# Patient Record
Sex: Female | Born: 1935 | Race: White | Hispanic: No | Smoking: Never smoker
Health system: Southern US, Community
[De-identification: ages and names within clinical notes are randomized; demographics above are authoritative.]

## PROBLEM LIST (undated history)

## (undated) DIAGNOSIS — K219 Gastro-esophageal reflux disease without esophagitis: Secondary | ICD-10-CM

## (undated) DIAGNOSIS — I480 Paroxysmal atrial fibrillation: Secondary | ICD-10-CM

## (undated) DIAGNOSIS — I442 Atrioventricular block, complete: Secondary | ICD-10-CM

## (undated) DIAGNOSIS — Z950 Presence of cardiac pacemaker: Secondary | ICD-10-CM

## (undated) DIAGNOSIS — I10 Essential (primary) hypertension: Secondary | ICD-10-CM

## (undated) HISTORY — DX: Atrioventricular block, complete: I44.2

## (undated) HISTORY — DX: Paroxysmal atrial fibrillation: I48.0

## (undated) HISTORY — DX: Gastro-esophageal reflux disease without esophagitis: K21.9

## (undated) HISTORY — DX: Essential (primary) hypertension: I10

---

## 2003-07-26 HISTORY — PX: PACEMAKER INSERTION: SHX728

## 2004-02-27 ENCOUNTER — Ambulatory Visit: Payer: Self-pay | Admitting: Internal Medicine

## 2004-02-27 ENCOUNTER — Inpatient Hospital Stay (HOSPITAL_COMMUNITY): Admission: AD | Admit: 2004-02-27 | Discharge: 2004-02-29 | Payer: Self-pay | Admitting: Internal Medicine

## 2004-03-17 ENCOUNTER — Encounter: Payer: Self-pay | Admitting: Cardiology

## 2004-12-07 IMAGING — CR DG CHEST 1V PORT
1 series · 1 of 1 positions shown · non-contrast
Comparison: none

CLINICAL DATA: status post pacemaker placement
 PORTABLE CHEST ONE VIEW 02/28/04
 Left-sided pacer is in place.  Leads are in the right atrium and right ventricle.  No pneumothorax.  There is cardiomegaly with mild interstitial edema and bibasilar atelectasis.  
 IMPRESSION
 Left-sided pacer without pneumothorax.  
 Mild interstitial edema and bibasilar atelectasis.

[view not recorded]
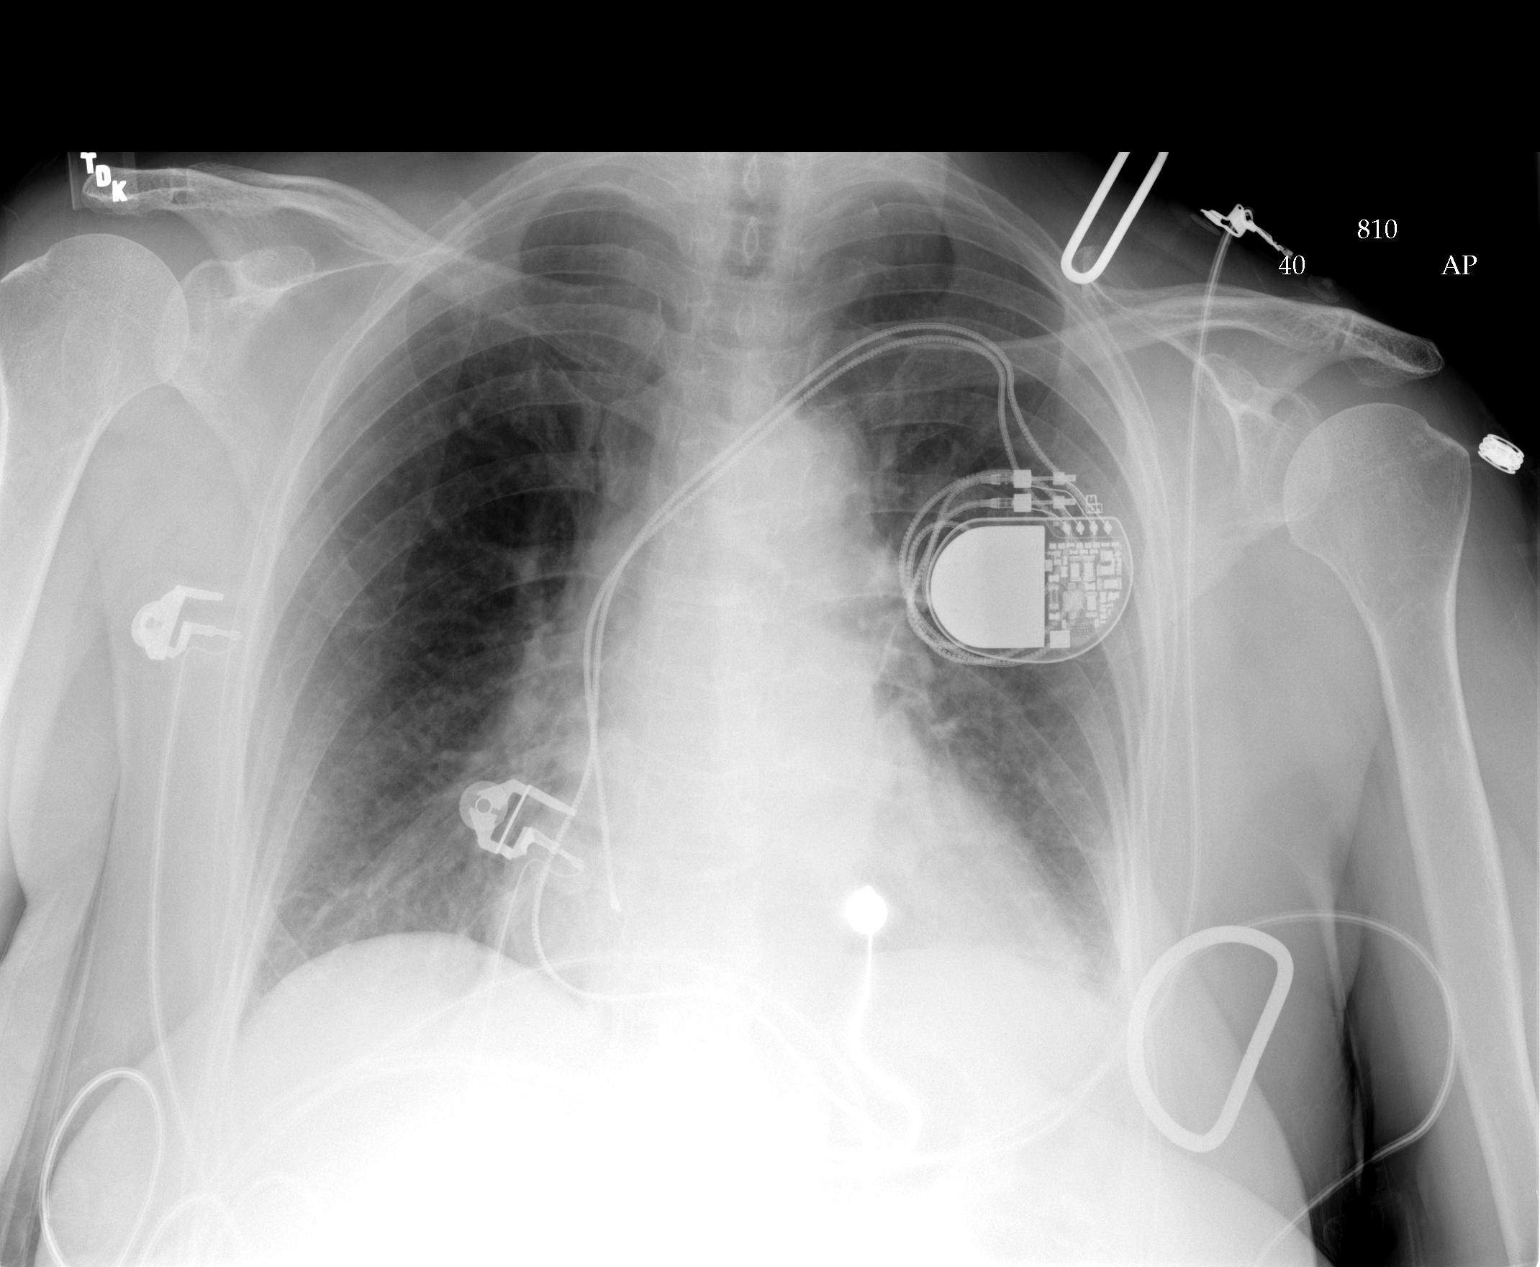

[1 of 1 positions shown; findings below may reference images not displayed]

## 2004-12-14 ENCOUNTER — Ambulatory Visit: Payer: Self-pay | Admitting: Cardiology

## 2004-12-14 ENCOUNTER — Encounter: Payer: Self-pay | Admitting: Internal Medicine

## 2005-12-13 ENCOUNTER — Ambulatory Visit: Payer: Self-pay | Admitting: Cardiology

## 2006-01-19 ENCOUNTER — Ambulatory Visit: Payer: Self-pay | Admitting: Cardiology

## 2007-02-02 ENCOUNTER — Ambulatory Visit: Payer: Self-pay | Admitting: Cardiology

## 2007-08-24 ENCOUNTER — Ambulatory Visit: Payer: Self-pay | Admitting: Internal Medicine

## 2008-03-21 ENCOUNTER — Ambulatory Visit: Payer: Self-pay | Admitting: Internal Medicine

## 2008-09-09 ENCOUNTER — Encounter: Payer: Self-pay | Admitting: Internal Medicine

## 2008-09-18 ENCOUNTER — Ambulatory Visit: Payer: Self-pay | Admitting: Internal Medicine

## 2008-11-04 ENCOUNTER — Ambulatory Visit: Payer: Self-pay | Admitting: Internal Medicine

## 2009-02-23 ENCOUNTER — Ambulatory Visit: Payer: Self-pay | Admitting: Internal Medicine

## 2009-04-09 DIAGNOSIS — I472 Ventricular tachycardia, unspecified: Secondary | ICD-10-CM | POA: Insufficient documentation

## 2009-04-09 DIAGNOSIS — Z950 Presence of cardiac pacemaker: Secondary | ICD-10-CM | POA: Insufficient documentation

## 2009-04-09 DIAGNOSIS — I10 Essential (primary) hypertension: Secondary | ICD-10-CM | POA: Insufficient documentation

## 2009-04-09 DIAGNOSIS — I442 Atrioventricular block, complete: Secondary | ICD-10-CM | POA: Insufficient documentation

## 2009-04-10 ENCOUNTER — Ambulatory Visit: Payer: Self-pay | Admitting: Internal Medicine

## 2009-05-25 ENCOUNTER — Ambulatory Visit: Payer: Self-pay | Admitting: Internal Medicine

## 2009-08-24 ENCOUNTER — Ambulatory Visit: Payer: Self-pay | Admitting: Internal Medicine

## 2009-10-16 ENCOUNTER — Ambulatory Visit: Payer: Self-pay | Admitting: Internal Medicine

## 2009-10-16 DIAGNOSIS — I4891 Unspecified atrial fibrillation: Secondary | ICD-10-CM | POA: Insufficient documentation

## 2009-11-23 ENCOUNTER — Ambulatory Visit: Payer: Self-pay | Admitting: Internal Medicine

## 2010-02-22 ENCOUNTER — Ambulatory Visit: Payer: Self-pay | Admitting: Internal Medicine

## 2010-05-21 ENCOUNTER — Encounter (INDEPENDENT_AMBULATORY_CARE_PROVIDER_SITE_OTHER): Payer: Self-pay | Admitting: *Deleted

## 2010-05-24 ENCOUNTER — Ambulatory Visit: Payer: Self-pay | Admitting: Internal Medicine

## 2010-06-11 ENCOUNTER — Ambulatory Visit: Payer: Self-pay | Admitting: Internal Medicine

## 2010-08-23 ENCOUNTER — Encounter: Payer: Self-pay | Admitting: Internal Medicine

## 2010-08-24 ENCOUNTER — Ambulatory Visit: Payer: Self-pay | Admitting: Internal Medicine

## 2010-08-24 NOTE — Cardiovascular Report (Signed)
Summary: TTM   TTM   Imported By: Roderic Ovens 03/10/2010 10:52:23  _____________________________________________________________________  External Attachment:    Type:   Image     Comment:   External Document

## 2010-08-24 NOTE — Assessment & Plan Note (Signed)
Summary: 6 mo fu per march reminder-srs   Visit Type:  Pacemaker check Primary Provider:  Sasser   History of Present Illness: The patient presents today for routine electrophysiology followup. She reports doing very well since last being seen in our clinic. The patient denies symptoms of palpitations, chest pain, shortness of breath, orthopnea, PND, lower extremity edema, dizziness, presyncope, syncope, or neurologic sequela. The patient is tolerating medications without difficulties and is otherwise without complaint today.   Preventive Screening-Counseling & Management  Alcohol-Tobacco     Smoking Status: never  Current Medications (verified): 1)  Evista 60 Mg Tabs (Raloxifene Hcl) .... Once Daily 2)  Niacin Cr 500 Mg Cr-Tabs (Niacin) .... Take 1 Tablet By Mouth Three Times A Day 3)  Aspirin 81 Mg Tbec (Aspirin) .... Take One Tablet By Mouth Daily 4)  Metoprolol Tartrate 25 Mg Tabs (Metoprolol Tartrate) .... Take One Tablet By Mouth Twice A Day 5)  Calcium Carbonate-Vitamin D 600-400 Mg-Unit  Tabs (Calcium Carbonate-Vitamin D) .... Once Daily 6)  Glucosamine-Chondroitin   Caps (Glucosamine-Chondroit-Vit C-Mn) .... Once Daily 7)  Fish Oil 1000 Mg Caps (Omega-3 Fatty Acids) .... Take 1 Tablet By Mouth Two Times A Day  Allergies (verified): No Known Drug Allergies  Comments:  Nurse/Medical Assistant: The patient's medications and allergies were reviewed with the patient and were updated in the Medication and Allergy Lists. Bottles/List reviewed.  Past History:  Past Medical History: PACEMAKER (ICD-V45.Marland Kitchen01) VENTRICULAR TACHYCARDIA (ICD-427.1) AV BLOCK, COMPLETE (ICD-426.0) HYPERTENSION, UNSPECIFIED (ICD-401.9) Paroxysmal atrial fibrillation  Past Surgical History: Reviewed history from 04/09/2009 and no changes required. pacemaker --  Medtronic Kappa [id number]  for Complete heart block with polymorphic ventricular  tachycardia.  ---- 02/27/04  Social History: Reviewed  history from 04/09/2009 and no changes required. Retired  Widowed  Tobacco Use - No.  Alcohol Use - no Drug Use - no  Vital Signs:  Patient profile:   76 year old female Height:      63 inches Weight:      162 pounds BMI:     28.80 Pulse rate:   80 / minute BP sitting:   144 / 82  (left arm) Cuff size:   regular  Vitals Entered By: Carlye Grippe (October 16, 2009 4:05 PM)  Nutrition Counseling: Patient's BMI is greater than 25 and therefore counseled on weight management options.  Physical Exam  General:  Well developed, well nourished, in no acute distress. Head:  normocephalic and atraumatic Mouth:  Teeth, gums and palate normal. Oral mucosa normal. Neck:  Neck supple, no JVD. No masses, thyromegaly or abnormal cervical nodes. Lungs:  Clear bilaterally to auscultation and percussion. Heart:  Non-displaced PMI, chest non-tender; regular rate and rhythm, S1, S2 without murmurs, rubs or gallops. Carotid upstroke normal, no bruit. Normal abdominal aortic size, no bruits. Femorals normal pulses, no bruits. Pedals normal pulses. No edema, no varicosities. Abdomen:  Bowel sounds positive; abdomen soft and non-tender without masses, organomegaly, or hernias noted. No hepatosplenomegaly. Msk:  Back normal, normal gait. Muscle strength and tone normal. Pulses:  pulses normal in all 4 extremities Extremities:  No clubbing or cyanosis. Neurologic:  Alert and oriented x 3.   PPM Specifications Following MD:  Sherryl Manges, MD     PPM Vendor:  Medtronic     PPM Model Number:  [id number]     [facility] Serial Number:  [id number] PPM DOI:  02/27/2004     PPM Implanting MD:  Sherryl Manges, MD  Lead 1    Location:  RA     DOI: 02/27/2004     Model #: [id number]     Serial #: [id number]     Status: active Lead 2    Location: RV     DOI: 02/27/2004     Model #: [id number]     Serial #: [id number]     Status: active  Magnet Response Rate:  BOL 85 ERI  65  Indications:  CHB with Torsades   PPM Follow Up Remote  Check?  No Battery Voltage:  2.75 V     Battery Est. Longevity:  3 years     Pacer Dependent:  No       PPM Device Measurements Atrium  Amplitude: 2.0 mV, Impedance: 443 ohms, Threshold: 1.0 V at 0.4 msec Right Ventricle  Amplitude: 5.6 mV, Impedance: 759 ohms, Threshold: 1.0 V at 0.4 msec  Episodes MS Episodes:  9     Percent Mode Switch:  0.2%     Coumadin:  No Ventricular High Rate:  0     Atrial Pacing:  33.3%     Ventricular Pacing:  99.9%  Parameters Mode:  DDDR     Lower Rate Limit:  60     Upper Rate Limit:  130 Paced AV Delay:  150     Sensed AV Delay:  120 Rate Response Parameters:  ADL response-3, Exertion response-3, Threshold-Med/Low, Acceleration-30 seconds, Decelerationn-Exercise Next Cardiology Appt Due:  03/25/2010 Tech Comments:  Longest A-fib 6:40 hours, - coumadin.  No parameter changes.  TTM's with [facility].  ROV 6 months in the [place] clinic. Altha Harm, LPN  October 16, 2009 4:11 PM  MD Comments:  agree with above  Impression & Recommendations:  Problem # 1:  AV BLOCK, COMPLETE (ICD-426.0) Normal pacemaker function no changes  Problem # 2:  ATRIAL FIBRILLATION (ICD-427.31) The patient's pacemaker suggests afib (longest episode is 6 hours).  I have discussed my concern for stroke with the patient.  Her CHADS2 score is 0, though she is approaching age 29.  We will therefore increase ASA to 325mg  daily today.  We will consider coumadin if her CHADS2 score elevates or if she has more prolonged episodes of afib.  Patient Instructions: 1)  return in 6 months

## 2010-08-24 NOTE — Cardiovascular Report (Signed)
 Summary: TTM  TTM   Imported By: Kathi Dilling 03/13/2009 13:37:37  _____________________________________________________________________  External Attachment:    Type:   Image     Comment:   External Document

## 2010-08-24 NOTE — Cardiovascular Report (Signed)
 Summary: TTM  TTM   Imported By: Kathi Dilling 07/09/2009 10:54:25  _____________________________________________________________________  External Attachment:    Type:   Image     Comment:   External Document

## 2010-08-24 NOTE — Cardiovascular Report (Signed)
Summary: Card Device Clinic/ INTERROGATION REPORT  Card Device Clinic/ INTERROGATION REPORT   Imported By: Dorise Hiss 06/16/2010 10:02:26  _____________________________________________________________________  External Attachment:    Type:   Image     Comment:   External Document

## 2010-08-24 NOTE — Cardiovascular Report (Signed)
 Summary: Office Visit  Office Visit   Imported By: Kathi Dilling 04/23/2009 16:26:01  _____________________________________________________________________  External Attachment:    Type:   Image     Comment:   External Document

## 2010-08-24 NOTE — Cardiovascular Report (Signed)
Summary: TTM   TTM   Imported By: Roderic Ovens 12/08/2009 16:01:55  _____________________________________________________________________  External Attachment:    Type:   Image     Comment:   External Document

## 2010-08-24 NOTE — Cardiovascular Report (Signed)
Summary: TTM   TTM   Imported By: Roderic Ovens 09/11/2009 15:50:52  _____________________________________________________________________  External Attachment:    Type:   Image     Comment:   External Document

## 2010-08-24 NOTE — Procedures (Signed)
 Summary: PACER CHECK-SRS   Current Medications (verified): 1)  Evista 60 Mg Tabs (Raloxifene Hcl) .... Once Daily 2)  Niacin Cr 1000 Mg Cr-Tabs (Niacin) .... Once Daily 3)  Aspirin 81 Mg Tbec (Aspirin) .... Take One Tablet By Mouth Daily 4)  Metoprolol Tartrate 25 Mg Tabs (Metoprolol Tartrate) .... Take One Tablet By Mouth Twice A Day 5)  Calcium Carbonate-Vitamin D 600-400 Mg-Unit  Tabs (Calcium Carbonate-Vitamin D) .... Once Daily 6)  Glucosamine-Chondroitin   Caps (Glucosamine-Chondroit-Vit C-Mn) .... Once Daily 7)  Fish Oil   Oil (Fish Oil) .... 1000mg  Once Daily  Allergies (verified): No Known Drug Allergies  PPM Specifications Following MD:  Elspeth Sage, MD     PPM Vendor:  Medtronic     PPM Model Number:  [id number]     PPM Serial Number:  [id number] PPM DOI:  02/27/2004     PPM Implanting MD:  Elspeth Sage, MD  Lead 1    Location: RA     DOI: 02/27/2004     Model #: 4923     Serial #: [id number]     Status: active Lead 2    Location: RV     DOI: 02/27/2004     Model #: 4923     Serial #: [id number]     Status: active  Magnet Response Rate:  BOL 85 ERI  65  Indications:  CHB with Torsades   PPM Follow Up Remote Check?  No Battery Voltage:  2.75 V     Battery Est. Longevity:  3 years     Pacer Dependent:  No       PPM Device Measurements Atrium  Amplitude: 2.8 mV, Impedance: 472 ohms, Threshold: 0.5 V at 0.4 msec Right Ventricle  Amplitude: 8 mV, Impedance: 776 ohms, Threshold: 0.75 V at 0.4 msec  Episodes MS Episodes:  5     Percent Mode Switch:  <0.1%     Coumadin:  No Ventricular High Rate:  1     Atrial Pacing:  25.6%     Ventricular Pacing:  100%  Parameters Mode:  DDDR     Lower Rate Limit:  60     Upper Rate Limit:  130 Paced AV Delay:  150     Sensed AV Delay:  120 Rate Response Parameters:  ADL response-3, Exertion response-3, Threshold-Med/Low, Acceleration-30 seconds, Decelerationn-Exercise Next Cardiology Appt Due:  09/22/2009 Tech Comments:  Normal device  function.  No changes made today.  Longest MS episode was 10 seconds.  VHR episode was NSVT-2 seconds.  Pt with no complaints.  ROV 6 months Dr Kelsie in [place]. Jeoffrey Arm RN BSN  April 10, 2009 8:56 AM

## 2010-08-24 NOTE — Miscellaneous (Signed)
" °  Clinical Lists Changes  Observations: Added new observation of PPM INDICATN: CHB with Torsades (09/09/2008 11:26) Added new observation of MAGNET RTE: BOL 85 ERI  65 (09/09/2008 11:26) Added new observation of PPMLEADSTAT2: active (09/09/2008 11:26) Added new observation of PPMLEADSER2: [id number] (09/09/2008 11:26) Added new observation of PPMLEADMOD2: 5076  (09/09/2008 11:26) Added new observation of PPMLEADDOI2: 02/27/2004  (09/09/2008 11:26) Added new observation of PPMLEADLOC2: RV  (09/09/2008 11:26) Added new observation of PPMLEADSTAT1: active  (09/09/2008 11:26) Added new observation of PPMLEADSER1: [id number]  (09/09/2008 11:26) Added new observation of PPMLEADMOD1: 5076  (09/09/2008 11:26) Added new observation of PPMLEADDOI1: 02/27/2004  (09/09/2008 11:26) Added new observation of PPMLEADLOC1: RA  (09/09/2008 11:26) Added new observation of PPM IMP MD: Elspeth Sage, MD  (09/09/2008 11:26) Added new observation of PPM DOI: 02/27/2004  (09/09/2008 11:26) Added new observation of PPM SERL#: [id number]  (09/09/2008 11:26) Added new observation of PPM MODL#: [id number]  (09/09/2008 88:73) Added new observation of PACEMAKERMFG: Medtronic  (09/09/2008 11:26) Added new observation of CARDIO MD: Elspeth Sage, MD  (09/09/2008 11:26)      PPM Specifications Following MD:  Elspeth Sage, MD     PPM Vendor:  Medtronic     PPM Model Number:  [id number]     PPM Serial Number:  [id number] PPM DOI:  02/27/2004       Lead 1    Location: RA     DOI: 02/27/2004     Model #: 4923     Serial #: [id number]     Status: active Lead 2    Location: RV     DOI: 02/27/2004     Model #: 4923     Serial #: [id number]     Status: active  Magnet Response Rate:  BOL 85 ERI  65  Indications:  CHB with Torsades  ICD Specifications Following MD: Elspeth Sage, MD       "

## 2010-08-24 NOTE — Assessment & Plan Note (Signed)
Summary: 6 mo fu per sept reminder-srs   Visit Type:  Pacemaker check Primary Provider:  Sasser   History of Present Illness: The patient presents today for routine electrophysiology followup. He reports doing very well since last being seen in our clinic. The patient denies symptoms of palpitations, chest pain, shortness of breath, orthopnea, PND, lower extremity edema, dizziness, presyncope, syncope, or neurologic sequela. The patient is tolerating medications without difficulties and is otherwise without complaint today.   Preventive Screening-Counseling & Management  Alcohol-Tobacco     Smoking Status: never  Current Medications (verified): 1)  Evista 60 Mg Tabs (Raloxifene Hcl) .... Once Daily 2)  Niacin Cr 500 Mg Cr-Tabs (Niacin) .... Take 1 Tablet By Mouth Three Times A Day 3)  Aspirin 325 Mg Tabs (Aspirin) .... Take 1 Tablet By Mouth Once A Day 4)  Metoprolol Tartrate 25 Mg Tabs (Metoprolol Tartrate) .... Take One Tablet By Mouth Twice A Day 5)  Calcium Carbonate-Vitamin D 600-400 Mg-Unit  Tabs (Calcium Carbonate-Vitamin D) .... Take 2 Tablet By Mouth Once A Day 6)  Glucosamine-Chondroitin   Caps (Glucosamine-Chondroit-Vit C-Mn) .... Take 1 Tablet By Mouth Once A Day 7)  Fish Oil 1000 Mg Caps (Omega-3 Fatty Acids) .... Take 1 Tablet By Mouth Two Times A Day  Allergies (verified): No Known Drug Allergies  Comments:  Nurse/Medical Assistant: The patient's medication list and allergies were reviewed with the patient and were updated in the Medication and Allergy Lists.  Past History:  Past Medical History: Reviewed history from 10/16/2009 and no changes required. PACEMAKER (ICD-V45.Marland Kitchen01) VENTRICULAR TACHYCARDIA (ICD-427.1) AV BLOCK, COMPLETE (ICD-426.0) HYPERTENSION, UNSPECIFIED (ICD-401.9) Paroxysmal atrial fibrillation  Past Surgical History: Reviewed history from 04/09/2009 and no changes required. pacemaker --  Medtronic Kappa [id number]  for Complete heart block with  polymorphic ventricular  tachycardia.  ---- 02/27/04  Social History: Reviewed history from 04/09/2009 and no changes required. Retired  Widowed  Tobacco Use - No.  Alcohol Use - no Drug Use - no  Review of Systems       All systems are reviewed and negative except as listed in the HPI.   Vital Signs:  Patient profile:   76 year old female Height:      63 inches Weight:      158 pounds Pulse rate:   92 / minute BP sitting:   143 / 85  (left arm) Cuff size:   regular  Vitals Entered By: Carlye Grippe (June 11, 2010 4:02 PM)  Physical Exam  General:  Well developed, well nourished, in no acute distress. Head:  normocephalic and atraumatic Mouth:  Teeth, gums and palate normal. Oral mucosa normal. Neck:  Neck supple, no JVD. No masses, thyromegaly or abnormal cervical nodes. Chest Wall:  pacemaker pocket is well healed Lungs:  Clear bilaterally to auscultation and percussion. Heart:  Non-displaced PMI, chest non-tender; regular rate and rhythm, S1, S2 without murmurs, rubs or gallops. Carotid upstroke normal, no bruit. Normal abdominal aortic size, no bruits. Femorals normal pulses, no bruits. Pedals normal pulses. No edema, no varicosities. Abdomen:  Bowel sounds positive; abdomen soft and non-tender without masses, organomegaly, or hernias noted. No hepatosplenomegaly. Msk:  Back normal, normal gait. Muscle strength and tone normal. Pulses:  pulses normal in all 4 extremities Extremities:  No clubbing or cyanosis. Neurologic:  Alert and oriented x 3.   PPM Specifications Following MD:  Sherryl Manges, MD     PPM Vendor:  Medtronic     PPM Model Number:  [id number]  PPM Serial Number:  [id number] PPM DOI:  02/27/2004     PPM Implanting MD:  Sherryl Manges, MD  Lead 1    Location: RA     DOI: 02/27/2004     Model #: 4098     Serial #: [id number]     Status: active Lead 2    Location: RV     DOI: 02/27/2004     Model #: 5621     Serial #: [id number]     Status:  active  Magnet Response Rate:  BOL 85 ERI  65  Indications:  CHB with Torsades   PPM Follow Up Battery Voltage:  2.73 V     Battery Est. Longevity:  2 yrs     Pacer Dependent:  No       PPM Device Measurements Atrium  Amplitude: 2.80 mV, Impedance: 448 ohms, Threshold: 0.250 V at 0.40 msec Right Ventricle  Amplitude: 8.00 mV, Impedance: 592 ohms, Threshold: 0.50 V at 0.40 msec  Episodes MS Episodes:  9     Percent Mode Switch:  0.2%     Coumadin:  No Ventricular High Rate:  0     Atrial Pacing:  30.4%     Ventricular Pacing:  99.9%  Parameters Mode:  DDDR     Lower Rate Limit:  60     Upper Rate Limit:  130 Paced AV Delay:  150     Sensed AV Delay:  120 Rate Response Parameters:  ADL response-3, Exertion response-3, Threshold-Med/Low, Acceleration-30 seconds, Decelerationn-Exercise Next Cardiology Appt Due:  11/23/2010 Tech Comments:  9 AHR EPISODES--LONGEST WAS 7 HRS 51 MINUTES. - COUMADIN.  NORMAL DEVICE FUNCTION.  NO CHANGES MADE. ROV IN 6 MTHS W/DEVICE CLINIC. Vella Kohler  June 11, 2010 4:38 PM MD Comments:  AHR episodes are not clearly afib.  We have changed electrograms from summed to A/V to better clearify.  Some episodes may be over sensing and others appear to be retrograde atrial activity following V pacing.  Impression & Recommendations:  Problem # 1:  AV BLOCK, COMPLETE (ICD-426.0) doing well  normal pacemaker function as above  Problem # 2:  HYPERTENSION, UNSPECIFIED (ICD-401.9) stable salt restriction  Problem # 3:  ATRIAL FIBRILLATION (ICD-427.31) as above, we have changed electrograms on the pacemaker to better clearify whether this is artifact or actually afib Her CHADSVASC score is 2.  (age and female gender)  we will continue ASA at this time.  Patient Instructions: 1)  Your physician recommends that you continue on your current medications as directed. Please refer to the Current Medication list given to you today. 2)  Follow up in  6 months

## 2010-08-24 NOTE — Cardiovascular Report (Signed)
Summary: Card Device Clinic/ INTERROGATION REPORT  Card Device Clinic/ INTERROGATION REPORT   Imported By: Dorise Hiss 10/19/2009 10:58:30  _____________________________________________________________________  External Attachment:    Type:   Image     Comment:   External Document

## 2010-08-24 NOTE — Miscellaneous (Signed)
Summary: dx correction  Clinical Lists Changes  Problems: Changed problem from PACEMAKER (ICD-V45..01) to PACEMAKER, PERMANENT (ICD-V45.01)  changed the incorrect dx code to correct dx code 

## 2010-09-15 NOTE — Cardiovascular Report (Signed)
Summary: TTM   TTM   Imported By: Roderic Ovens 09/10/2010 12:03:18  _____________________________________________________________________  External Attachment:    Type:   Image     Comment:   External Document

## 2010-11-22 ENCOUNTER — Encounter: Payer: Self-pay | Admitting: Internal Medicine

## 2010-11-22 DIAGNOSIS — I442 Atrioventricular block, complete: Secondary | ICD-10-CM

## 2010-12-07 NOTE — Assessment & Plan Note (Signed)
[name]                         ELECTROPHYSIOLOGY OFFICE NOTE   EILIYAH, Cassandra Young                        MRN:          [id number]  DATE:02/02/2007                            DOB:          1935-06-21    Ms. Tse was seen today in the [facility] for followup of her  Medtronic Kappa pacemaker, implanted on February 27, 2004, for a complete  heart block.   Interrogation of her device demonstrates:  P waves of 2 to 2.8 millivolts with an atrial impedance of 459 ohms and  a threshold of 0.5 volts at 0.4 milliseconds.  Her R waves measured 4 to 5.6 millivolts with an RV impedance of 580  ohms with a threshold of 0.5 volts at 0.4 milliseconds.  Her battery voltage was 2.77 volts.  She was in complete heart block today with an escape at 40 beats per  minute.  She had six atrial high rate episodes, the longest of which lasted 46  seconds.  She is not currently on Coumadin therapy and her CHADS score is 1.  She had no ventricular high rate episodes.  She is programmed DDDR with low rate of 60 and upper rate of 130 with a  paced AV of 150 milliseconds and a sensed AV of 120 milliseconds.  She is currently A pacing 22% of the time and V pacing 100% of the time.   She will return to the clinic in January 2009, to see Dr. Graciela Husbands.      Gypsy Balsam, RN,BSN  Electronically Signed      Duke Salvia, MD, [facility]  Electronically Signed   AS/MedQ  DD: 02/02/2007  DT: 02/02/2007  Job #: [id number]

## 2010-12-07 NOTE — Cardiovascular Report (Signed)
[facility]                   [place] ELECTROPHYSIOLOGY DEVICE CLINIC NOTE   Cassandra, Young                        MRN:          [id number]  DATE:09/18/2008                            DOB:          02/01/1935    Cassandra Young is seen in followup for pacemaker implantation for complete  heart block associated with torsade de pointes.  She has had no atypical  problems.  She denies chest pain, shortness of breath, or peripheral  edema.   Her medications are unchanged and include adjusted niacin aspirin,  metoprolol, and she also takes fish oil.   PHYSICAL EXAMINATION:  VITAL SIGNS:  Her blood pressure today was mildly  elevated at 144/85, her pulse was 83.  LUNGS:  Clear.  HEART:  Sounds were regular, but her S2 was broadly split.  ABDOMEN:  Soft.  EXTREMITIES:  Without edema.   Interrogation of her Medtronic Kappa pulse generator demonstrates a P-  wave of 2.8 with impedance of 465.  The threshold was 0.5 at 0.4.  The R-  wave was 5 with impedance of 839.  The threshold was 1 V at 0.4.  Battery voltage was 2.76 with an estimated longevity of 4 years.  She is  a device-dependent.   IMPRESSION:  1. Complete heart block.  2. Status post pacer for the complete heart block.  3. Torsade associated with complete heart block.  4. Modest hypertension.   [name] is stable and asked her to follow up with Dr. Neita Carp about her  blood pressure.  We will see her again in 6 months' timeto begin remote  followup.     Duke Salvia, MD, [facility]  Electronically Signed    SCK/MedQ  DD: 09/18/2008  DT: 09/18/2008  Job #: [id number]   cc:   Fara Chute

## 2010-12-07 NOTE — Cardiovascular Report (Signed)
[facility]                   [place] ELECTROPHYSIOLOGY DEVICE CLINIC NOTE   HARBOUR, NORDMEYER                        MRN:          [id number]  DATE:08/24/2007                            DOB:          1934/11/01    Cassandra Young is seen after hiatus of a couple years following pacemaker  implantation a couple years before that for complete heart block,  bradycardia, dissociated torsade de pointes.  She has had no recurrent  syncope.  She has no exercise intolerance, chest pains or shortness of  breath.   MEDICATIONS:  Evista, niacin, aspirin, metoprolol 25 b.i.d. and a  variety of nutraceuticals.   PHYSICAL EXAMINATION:  VITAL SIGNS:  Blood pressure was borderline at  140/80 with a pulse of 76.  LUNGS:  Clear.  HEART:  Her heart sounds were regular.  EXTREMITIES:  Without edema.  SKIN:  Warm and dry.   Interrogation of her Medtronic Kappa pulse generator demonstrated a P  wave of 2 with impedance of 419 and threshold of 0.5 at 0.4.  The R wave  was 5.6 with impedance of 575, a threshold of 0.5 at 0.4.  Battery  voltage was 2.77.  She is 99% ventricular lead paced.   IMPRESSION:  1. Bradycardia, essentially device dependent.  2. Torsade de pointes secondary to #1.  3. Status post pacemaker, resolving #1 and #2.  4. Borderline blood pressure.   Ms. Heater is stable.  We will see her again in 6 months' time.     Duke Salvia, MD, [facility]  Electronically Signed    SCK/MedQ  DD: 08/24/2007  DT: 08/24/2007  Job #: [id number]   cc:   Fara Chute

## 2010-12-07 NOTE — Cardiovascular Report (Signed)
[facility]                   [place] ELECTROPHYSIOLOG[place] DEVICE CLINIC NOTE   MARGAREE, SANDHU                        MRN:          [id number]  DATE:03/21/2008                            DOB:          25-Nov-1934    Mrs. Hinojosa was seen in followup for pacemaker implantation for complete  heart block that was intermittently associated with torsades de pointes.  She has had no intercurrent problems.  She has no problems with chest  pain, shortness of breath, or exercise intolerance.   MEDICATIONS:  Include Evista, niacin, aspirin, and metoprolol.   PHYSICAL EXAMINATION:  VITAL SIGNS:  Her blood pressure is 140/80 with a  pulse of 76.  LUNGS:  Clear.  HEART:  Heart sounds were regular.  EXTREMITIES:  Without edema.   Interrogation of Medtronic pulse generator demonstrates P-wave of 2.8  with impedance of 467, and threshold of 0.5 at 0.4.  The R-wave was 5.6  with impedance of 657, threshold of 0.75 at 0.4.  Battery voltage  is  2.76.   IMPRESSION:  1. Bradycardia.  2. Torsades secondary to bradycardia.  3. Pacer secondary to above.   Mrs. Lopresti is stable, and we will plan to see her again in one year's  time.     Duke Salvia, MD, [facility]  Electronically Signed    SCK/MedQ  DD: 03/21/2008  DT: 03/22/2008  Job #: [id number]   cc:   Fara Chute

## 2010-12-10 NOTE — Op Note (Signed)
NAMEPLESHETTE, TOMASINI                 ACCOUNT NO.:  [id number]   MEDICAL RECORD NO.:  [id number]          PATIENT TYPE:  INP   LOCATION:  2906                         FACILITY:  [facility]   PHYSICIAN:  Duke Salvia, M.D.  DATE OF BIRTH:  [birth year]   DATE OF PROCEDURE:  02/27/2004  DATE OF DISCHARGE:  02/29/2004                                 OPERATIVE REPORT   PREOPERATIVE DIAGNOSIS:  Complete heart block with polymorphic ventricular  tachycardia.   POSTOPERATIVE DIAGNOSIS:  Complete heart block with polymorphic ventricular  tachycardia.   PROCEDURE:  Emergent dual-chamber pacemaker implantation.   Following obtaining informed consent, the patient was brought to the  electrophysiology laboratory where she was placed on the fluoroscopic table  in the supine position.  After routine prep and drape of the left upper  chest, Lidocaine was infiltrated in the prepectoral subclavicular region.  An incision was made and carried down to the level of the prepectoral fascia  using electrocautery and sharp dissection.  A pocket was formed similarly.  Hemostasis was obtained.   Thereafter, attention was turned to gaining access to the extrathoracic left  subclavian vein, which was accomplished without difficulty, without  aspiration or puncture of the artery.  Two separate medial punctures were  accomplished.  A 7-French, JR-4 sheaths were placed and two of these were  passed sequentially.  A Medtronic 5076, 58 cm active fixation ventricular  lead, serial #[id number], and a Medtronic 5076, 62 cm, serial #[id number].  Under fluoroscopic guidance, they were manipulated into the right  ventricular apex and the right atrium respectively where the bipolar R-wave  was 6.7 mV with a pacing impedance of 843 ohms and a threshold of 0.4 V at  0.5 msec, a currented threshold of 0.4 MA, and there was no diaphragmatic  pacing at 10 V.   Bipolar P-wave was 3.5 mV, with a pacing impedance of 652 ohms, a  threshold  of 0.7 V at 0.5 msec, a currented threshold of 11.3 MA, and again there was  no diaphragmatic pacing at 10 V.   The leads were secured to the prepectoral fascia and attached to a Medtronic  Kappa KDR-901 pulse generator, which showed a repeat canvas of [id number].  The pocket was copiously irrigated with antibiotic containing saline  solution, hemostasis was ensured, and the leads and the pulse generator were  placed in the pocket and secured to the prepectoral fascia.  The wound was  washed and dried.  The wound was then closed in three layers in the  normal fashion.  The wound was washed, dried, and Benzoin Steri-Strip  dressing was applied.  Needle counts, sponge counts, and instrument counts  were correct at the end of the procedure as reported by the staff.   The patient tolerated the procedure without apparent complications except  for hypertension.       SCK/MEDQ  D:  07/07/2004  T:  07/07/2004  Job:  [id number]   cc:   [facility]   [facility]   Electrophysiology Laboratory

## 2010-12-10 NOTE — Discharge Summary (Signed)
NAME:  Cassandra Young, Cassandra Young                           ACCOUNT NO.:  [id number]   MEDICAL RECORD NO.:  [id number]                   PATIENT TYPE:  INP   LOCATION:  2906                                 FACILITY:  [facility]   PHYSICIAN:  Duke Salvia, M.D.               DATE OF BIRTH:  1934-09-29   DATE OF ADMISSION:  02/27/2004  DATE OF DISCHARGE:  02/29/2004                                 DISCHARGE SUMMARY   DISCHARGE DIAGNOSES:  1. Sudden onset of complete heart block requiring emergent pacemaker     placement.  2. Concurrent episode of symptomatic torsades de pointes.   SECONDARY DIAGNOSIS:  No significant factors for thrombo-embolic or coronary  artery disease in her medical history.   PROCEDURE:  On February 27, 2004, implantation of a Medtronic Kappa [id number]  PTVDP, and the patient will be pacemaker dependent.  Serial number is  [id number].  The patient tolerated the procedure without complications.   DISPOSITION:  The patient is ready for discharge on post-procedure day #2.  The incision is healing nicely.  Post-placement interrogation shows that the  pacer was functioning adequately.  At the time of discharge the pacer is  sensing the atrium and pacing the ventricle appropriately.  The patient has  had no further symptoms of syncope, chest pain, dyspnea.  The patient is  ready for discharge.   DISCHARGE MEDICATIONS:  1. Enteric coated aspirin 81 mg daily.  2. She is to resume her home dose of Niacin.  3. If she has pain at the pacer site, Tylenol 325 mg one to two tabs q.4-6h.     p.r.n. pain.   DIET:  Low sodium, low cholesterol diet.   FOLLOWUP:  1. [facility], [place] office, for pacer clinic in two weeks.  2. Dr. Myrtis Ser, [facility], [place] office, in three to four weeks.  3. Dr. Graciela Husbands at [facility], [place] office, in three months.   HISTORY:  Ms. Weatherwax is a 76 year old female who had syncope.  She awoke at 2  a.m. in the morning of February 27, 2004,  feeling short of breath.  She had  been having increasing dyspnea over the past two to three weeks, but thought  it was hot weather.  Thinking that this particular attack was reflux, she  took two Tums and went back to bed.  Later she got up as the morning dawned.  She made up her bed and was putting cream on her feet, sitting on the edge  of the bed when without warning she fell backwards onto the bed, feet  dangling over the edge.  She awoke slightly confused and got up very weak  and called for help.  EMS transported her immediately to the emergency room  where electrocardiographic rhythm showed a complete heart block.  Her heart  rate was 50 to 60 beats per minutes.  Arrangements were immediately  made for  transport to [facility], but minutes before transport the patient  went into a symptomatic rapid rate, the EKG strip showed torsades.  She was  given IV lidocaine, oxygen by way of an Ambu-Bag and came around per ED  personnel.  She was then transported.  She received a percutaneous  transvenous pacer at [facility] and is now totally pacemaker  dependent.  Plan is to keep her for 48 hours observation.   HOSPITAL COURSE:  After arriving from [facility] to [facility] on February 27, 2004, she underwent an emergent implantation of a  Medtronic Kappa pacer without complications.  She has been kept at [facility] under surveillance and telemetry for a further 48 hours to  guard against pacer lead dislodgement.  The patient goes home with a sling  to keep the arm tethered to the side of the body, and with instructions on  how to increased mobilization of the left arm over time in the next one to  two weeks.   LABORATORY DATA:  TSH 1.895.  Fasting lipid profile:  Cholesterol 190,  triglycerides 130, HDL cholesterol was 49, LDL cholesterol was 115.      Maple Mirza, P.A.                    Duke Salvia, M.D.    GM/MEDQ  D:  02/29/2004   T:  03/01/2004  Job:  [id number]   cc:   Willa Rough, M.D.   Fara Chute  [address]  [place] [address]  Fax: [phone]

## 2010-12-10 NOTE — H&P (Signed)
NAME:  Cassandra Young, BEAIRD                           ACCOUNT NO.:  [id number]   MEDICAL RECORD NO.:  [id number]                   PATIENT TYPE:  INP   LOCATION:  2906                                 FACILITY:  [facility]   PHYSICIAN:  Maple Mirza, P.A.              DATE OF BIRTH:  01/25/1935   DATE OF ADMISSION:  02/27/2004  DATE OF DISCHARGE:                                HISTORY & PHYSICAL   PRIMARY CARE CAREGIVER:  Dr. Neita Carp.   ALLERGIES:  No known drug allergies.   HISTORY OF PRESENT ILLNESS:  Cassandra Young is a 76 year old female who had  syncope. She awoke at 2:00 this morning and felt short of breath (she had  been having increasing dyspnea for the past two to three weeks, but she  thought it was the hot weather). Thinking it was reflux she took two Tums  and went back to bed. Later she got up, made her bed, was putting cream on  her feet, sitting at the edge of her bed when, without warning, she fell  backwards onto the bed, feet dangling over the edge. She awoke slightly  confused, got up very WEAK, and called for help. In the emergency room  electrocardiographic rhythm was complete heart block. Heart rate was 50 to  60.  Arrangements to transport to [facility] were made  with Dr. Myrtis Ser, but minutes before transport she went into symptomatic rapid  rate on strip showing torsades. She was given IV lidocaine and oxygen via  Ambu bag and came around per ED personnel. She was then transported. She  received a percutaneous transvenous pacer here and she is now totally pacer  dependent. The plan is to keep her for 48 hours for observation. The patient  has only risk factor for coronary artery disease or thromboembolic event,  and that is age greater than 85. She has no known history of hypertension,  diabetes, coronary artery disease, and cholesterol value unknown. TSH  unknown. She has had a prior catheterization about eight years ago and at  that time it was a negative  study per patient. She was being evaluated for  chest pain; whether is was coronary artery disease versus reflux.   MEDICATIONS:  Only takes Niacin at home.   SOCIAL HISTORY:  Lives in [place]. She is a widow, lives alone. No tobacco,  ethanol, and no drugs. She is retired from [employer] work.   FAMILY HISTORY:  Mother died at age 9 from lung cancer. Father had CVA at  37. She has three sisters and two brothers living. No diabetes or coronary  artery disease. One sister died previously with Doreatha Martin disease.   REVIEW OF SYSTEMS:  In the last two to three weeks, no fevers, chills,  sweats, or weight change. HEENT: No nasal discharge, epistaxis, voice change  or vertigo. INTEGUMENT: No rashes or lesions. CARDIOPULMONARY:  Mild chest  pain  this morning as a result of the syncope. She does have dyspnea on  exertion over the past two to three weeks and she has been more fatigued  than usual. She does not have orthopnea, paroxysmal nocturnal dyspnea,  edema. She did have an episode of syncope as prescribed above. No  claudication symptoms, constant coughing, or wheezing. UROGENITAL: Nocturia  two to three times a night. NEUROPSYCHIATRIC: No weakness, numbness,  depression, or anxiety. GI: History of reflux, but  no internal bleeding, no  melena, and no hematemesis ENDOCRINE: The patient is unaware of her  endocrine status, but denies diabetes or thyroid disease. MUSCULOSKELETAL:  No pertinent complaints of arthralgias or joint swellings. All other systems  are negative.   PHYSICAL EXAMINATION:  VITAL SIGNS: Temperature 97.9, pulse 80, blood  pressure 137/53, respirations 20.  GENERAL: The patient is alert and oriented times three. No chest pain. Only  mild dyspnea.  HEENT: Normocephalic and atraumatic. Pupils equal, round, and reactive to  light. Extraocular movements intact. Sclerae icteric. Nares without  discharge.  NECK: Supple with no carotid bruits auscultated. No jugular venous   distention. No cervical lymphadenopathy.  HEART: After pacemaker placement, regular rate and rhythm without murmur.  LUNGS: Clear to auscultation and percussion bilaterally.  EXTREMITIES: No evidence of clubbing, cyanosis, edema, rashes, lesions, or  petechia.  MUSCULOSKELETAL:  No joint deformity, effusions, kyphosis, or scoliosis.  NEUROLOGIC: Alert and oriented times three. Cranial nerves II-XII grossly  intact. Grip strength is 5/5 bilaterally. She has 4/5 dorsalis pedis pulses  and 4/4 regular pulses.   Chest x-ray in the emergency room at [facility] shows mild CHF.  Electrocardiogram reveals rate of 56, third degree heart block.   LABORATORY DATA:  PT 12.5, INR 1.0, PTT 19.9. Complete blood count reveals  white count 5.4, hemoglobin 14.6, hematocrit 45, platelet count 222,000.  Serum electrolytes reveal sodium of 142, potassium 3.9, chloride 107,  bicarbonate 25, BUN 9, creatinine 1.1, glucose 146, SGOT 55, SGPT 99.  Urinalysis is negative. One set of cardiac enzymes taken on February 27, 2004,  reveal CK of 66, CK-MB 1.6, troponin-I 0.01.   PROBLEM LIST:  1. History of reflux.  2. Admission today with complete heart block and episode of (symptomatic)     torsades in the emergency room at [facility].   PLAN:  The patient has received a PDB DP and will remain in the hospital for  48 hour per Dr. Graciela Husbands.                                                Maple Mirza, P.A.    GM/MEDQ  D:  02/27/2004  T:  02/27/2004  Job:  [id number]

## 2010-12-29 ENCOUNTER — Encounter: Payer: Self-pay | Admitting: *Deleted

## 2010-12-30 ENCOUNTER — Encounter: Payer: Self-pay | Admitting: Internal Medicine

## 2010-12-30 ENCOUNTER — Ambulatory Visit (INDEPENDENT_AMBULATORY_CARE_PROVIDER_SITE_OTHER): Payer: Medicare Other | Admitting: Internal Medicine

## 2010-12-30 DIAGNOSIS — I442 Atrioventricular block, complete: Secondary | ICD-10-CM

## 2010-12-30 DIAGNOSIS — I10 Essential (primary) hypertension: Secondary | ICD-10-CM

## 2010-12-30 DIAGNOSIS — I4891 Unspecified atrial fibrillation: Secondary | ICD-10-CM

## 2010-12-30 NOTE — Progress Notes (Signed)
The patient presents today for routine electrophysiology followup.  Since last being seen in our clinic, the patient reports doing very well.  Today, she denies symptoms of palpitations, chest pain, shortness of breath, orthopnea, PND, lower extremity edema, dizziness, presyncope, syncope, or neurologic sequela.  The patient feels that she is tolerating medications without difficulties and is otherwise without complaint today.   Past Medical History  Diagnosis Date  . Cardiac pacemaker in situ   . Atrioventricular block, complete   . Hypertension   . Paroxysmal atrial fibrillation   . Esophageal reflux    Past Surgical History  Procedure Date  . Pacemaker insertion     Medtronic Kappa [id number]    Current Outpatient Prescriptions  Medication Sig Dispense Refill  . aspirin 325 MG tablet Take 325 mg by mouth daily.        . Calcium Carbonate-Vit D-Min 600-400 MG-UNIT TABS Take 2 tablets by mouth daily.        . fish oil-omega-3 fatty acids 1000 MG capsule Take 1 g by mouth 2 (two) times daily.        Marland Kitchen glucosamine-chondroitin 500-400 MG tablet Take 1 tablet by mouth daily.        . metoprolol tartrate (LOPRESSOR) 25 MG tablet Take 25 mg by mouth 2 (two) times daily.        . niacin (NIASPAN) 500 MG CR tablet Take 500 mg by mouth 3 (three) times daily.        . raloxifene (EVISTA) 60 MG tablet Take 60 mg by mouth daily.          No Known Allergies  History   Social History  . Marital Status: Widowed    Spouse Name: N/A    Number of Children: N/A  . Years of Education: N/A   Occupational History  . RETIRED    Social History Main Topics  . Smoking status: Never Smoker   . Smokeless tobacco: Never Used  . Alcohol Use: No  . Drug Use: No  . Sexually Active: Not on file   Other Topics Concern  . Not on file   Social History Narrative  . No narrative on file    Family History  Problem Relation Age of Onset  . Cancer Mother   . Stroke Father     ROS-  All systems are  reviewed and are negative except as outlined in the HPI above    Physical Exam: Filed Vitals:   12/30/10 1400  BP: 151/83  Pulse: 77  Height: 5\' 3"  (1.6 m)  Weight: 159 lb (72.122 kg)    GEN- The patient is well appearing, alert and oriented x 3 today.   Head- normocephalic, atraumatic Eyes-  Sclera clear, conjunctiva pink Ears- hearing intact Oropharynx- clear Neck- supple, no JVP Lymph- no cervical lymphadenopathy Lungs- Clear to ausculation bilaterally, normal work of breathing Chest- pacemaker pocket is well healed Heart- Regular rate and rhythm, no murmurs, rubs or gallops, PMI not laterally displaced GI- soft, NT, ND, + BS Extremities- no clubbing, cyanosis, or edema MS- no significant deformity or atrophy Skin- no rash or lesion Psych- euthymic mood, full affect Neuro- strength and sensation are intact  Pacemaker interrogation- reviewed in detail today,  See PACEART report  Assessment and Plan:

## 2010-12-30 NOTE — Assessment & Plan Note (Signed)
Above goal Salt restriction advised I have instructed her to check her BP closely at home and follow-up with PCP.

## 2010-12-30 NOTE — Assessment & Plan Note (Signed)
Normal pacemaker function See Pace Art report No changes today  

## 2010-12-30 NOTE — Assessment & Plan Note (Signed)
Stable Pacemaker confirms afib, longest episode is 6 hours Her CHADSVasc score is 31 (female, age, htn).  She should ideally be treated with coumadin, pradaxa, or xarelto.  At this time, she wishes to continue ASA but will consider switching if afib duration increases upon return.

## 2011-02-21 ENCOUNTER — Encounter: Payer: Self-pay | Admitting: Internal Medicine

## 2011-02-21 DIAGNOSIS — I442 Atrioventricular block, complete: Secondary | ICD-10-CM

## 2011-03-31 ENCOUNTER — Encounter: Payer: Medicare Other | Admitting: *Deleted

## 2011-04-07 ENCOUNTER — Encounter: Payer: Self-pay | Admitting: *Deleted

## 2011-04-11 ENCOUNTER — Telehealth: Payer: Self-pay | Admitting: Internal Medicine

## 2011-04-15 NOTE — Telephone Encounter (Signed)
Spoke w/pt to let know transmitter was ordered. Verified address.

## 2011-05-11 ENCOUNTER — Ambulatory Visit (INDEPENDENT_AMBULATORY_CARE_PROVIDER_SITE_OTHER): Payer: Medicare Other | Admitting: *Deleted

## 2011-05-11 ENCOUNTER — Other Ambulatory Visit: Payer: Self-pay

## 2011-05-11 ENCOUNTER — Encounter: Payer: Self-pay | Admitting: Internal Medicine

## 2011-05-11 DIAGNOSIS — I472 Ventricular tachycardia, unspecified: Secondary | ICD-10-CM

## 2011-05-11 DIAGNOSIS — I442 Atrioventricular block, complete: Secondary | ICD-10-CM

## 2011-05-11 DIAGNOSIS — Z950 Presence of cardiac pacemaker: Secondary | ICD-10-CM

## 2011-05-11 DIAGNOSIS — I4891 Unspecified atrial fibrillation: Secondary | ICD-10-CM

## 2011-05-13 LAB — REMOTE PACEMAKER DEVICE
AL AMPLITUDE: 2.8 mv
AL IMPEDENCE PM: 456 Ohm
ATRIAL PACING PM: 28
BAMS-0001: 175 {beats}/min
BATTERY VOLTAGE: 2.7 v
BRDY-0002RV: 60 {beats}/min
BRDY-0003RV: 130 {beats}/min
BRDY-0004RV: 130 {beats}/min
RV LEAD IMPEDENCE PM: 746 Ohm
RV LEAD THRESHOLD: 0.625 v
VENTRICULAR PACING PM: 100

## 2011-05-19 NOTE — Progress Notes (Signed)
Pacer remote check  

## 2011-05-23 ENCOUNTER — Encounter: Payer: Self-pay | Admitting: *Deleted

## 2011-06-07 ENCOUNTER — Encounter: Payer: Medicare Other | Admitting: *Deleted

## 2011-08-11 ENCOUNTER — Encounter: Payer: Medicare Other | Admitting: *Deleted

## 2011-08-15 ENCOUNTER — Encounter: Payer: Self-pay | Admitting: *Deleted

## 2011-08-18 ENCOUNTER — Telehealth: Payer: Self-pay | Admitting: Internal Medicine

## 2011-08-18 NOTE — Telephone Encounter (Signed)
New Problem:     Patient called in wondering if the Device transmission she sent in either yesterday or the day before came through. Please call back.

## 2011-08-19 NOTE — Telephone Encounter (Signed)
Have tried several times to make contact with patient but number just rings busy. Transmission was not received. Pt needs to send in transmission again. Called contact person but number is disconnected.

## 2011-08-22 ENCOUNTER — Telehealth: Payer: Self-pay | Admitting: Internal Medicine

## 2011-08-22 NOTE — Telephone Encounter (Signed)
Unable to reach pt due to phone ringing busy and ie disconnected

## 2011-08-22 NOTE — Telephone Encounter (Signed)
New Problem:    Patient called in wanting to know if her last transmission that she did on 08/17/11 came through. Pelase call back.

## 2012-01-19 ENCOUNTER — Ambulatory Visit (INDEPENDENT_AMBULATORY_CARE_PROVIDER_SITE_OTHER): Payer: Medicare Other | Admitting: Internal Medicine

## 2012-01-19 ENCOUNTER — Encounter: Payer: Self-pay | Admitting: Internal Medicine

## 2012-01-19 VITALS — BP 146/88 | HR 80 | Resp 16 | Ht 63.0 in | Wt 157.0 lb

## 2012-01-19 DIAGNOSIS — I472 Ventricular tachycardia, unspecified: Secondary | ICD-10-CM

## 2012-01-19 DIAGNOSIS — I442 Atrioventricular block, complete: Secondary | ICD-10-CM

## 2012-01-19 DIAGNOSIS — I4891 Unspecified atrial fibrillation: Secondary | ICD-10-CM

## 2012-01-19 DIAGNOSIS — I10 Essential (primary) hypertension: Secondary | ICD-10-CM

## 2012-01-19 LAB — PACEMAKER DEVICE OBSERVATION
AL AMPLITUDE: 2 mv
AL IMPEDENCE PM: 445 Ohm
AL THRESHOLD: 0.75 v
ATRIAL PACING PM: 30
BAMS-0001: 175 {beats}/min
BATTERY VOLTAGE: 2.69 v
RV LEAD AMPLITUDE: 11.2 mv
RV LEAD IMPEDENCE PM: 738 Ohm
RV LEAD THRESHOLD: 0.625 v
VENTRICULAR PACING PM: 100

## 2012-01-19 NOTE — Patient Instructions (Addendum)
Monthly phone device checks.  Follow up with Kristen in 6 months.You will receive a reminder letter in the mail in about 4 months reminding you to call and schedule your appointment. If you don't receive this letter, please contact our office.   Follow up Dr. Johney Frame in 1 year. You will receive a reminder letter in the mail in about 10 months reminding you to call and schedule your appointment. If you don't receive this letter, please contact our office.

## 2012-01-19 NOTE — Assessment & Plan Note (Signed)
Pacemaker confirms afib, only 1 episode since last visit (13 hours) Her CHADSVasc score is 23 (female, age, htn).  She should ideally be treated with coumadin, pradaxa, or xarelto.  At this time, she remains very clear that she wishes to continue ASA but will consider switching if afib duration increases in the future.

## 2012-01-19 NOTE — Progress Notes (Signed)
PCP:Estanislado Pandy, MD  The patient presents today for routine electrophysiology followup.  Since last being seen in our clinic, the patient reports doing very well. She remains active and is without concerns today.  Today, she denies symptoms of palpitations, chest pain, shortness of breath, orthopnea, PND, lower extremity edema, dizziness, presyncope, syncope, or neurologic sequela.  The patient feels that she is tolerating medications without difficulties and is otherwise without complaint today.   Past Medical History  Diagnosis Date  . Cardiac pacemaker in situ   . Atrioventricular block, complete   . Hypertension   . Paroxysmal atrial fibrillation   . Esophageal reflux    Past Surgical History  Procedure Date  . Pacemaker insertion 2005    Medtronic Kappa [id number]    Current Outpatient Prescriptions  Medication Sig Dispense Refill  . aspirin 325 MG tablet Take 325 mg by mouth daily.        . Calcium Carbonate-Vit D-Min 600-400 MG-UNIT TABS Take 2 tablets by mouth daily.        . fish oil-omega-3 fatty acids 1000 MG capsule Take 1 g by mouth 2 (two) times daily.        Marland Kitchen glucosamine-chondroitin 500-400 MG tablet Take 1 tablet by mouth daily.        . metoprolol tartrate (LOPRESSOR) 25 MG tablet Take 25 mg by mouth 2 (two) times daily.        . niacin (NIASPAN) 500 MG CR tablet Take 500 mg by mouth 4 (four) times daily.       . raloxifene (EVISTA) 60 MG tablet Take 60 mg by mouth daily.          No Known Allergies  History   Social History  . Marital Status: Widowed    Spouse Name: N/A    Number of Children: N/A  . Years of Education: N/A   Occupational History  . RETIRED    Social History Main Topics  . Smoking status: Never Smoker   . Smokeless tobacco: Never Used  . Alcohol Use: No  . Drug Use: No  . Sexually Active: Not on file   Other Topics Concern  . Not on file   Social History Narrative  . No narrative on file    Family History  Problem Relation Age  of Onset  . Cancer Mother   . Stroke Father      Physical Exam: Filed Vitals:   01/19/12 0949  BP: 146/88  Pulse: 80  Resp: 16  Height: 5\' 3"  (1.6 m)  Weight: 157 lb (71.215 kg)    GEN- The patient is well appearing, alert and oriented x 3 today.   Head- normocephalic, atraumatic Eyes-  Sclera clear, conjunctiva pink Ears- hearing intact Oropharynx- clear Neck- supple, no JVP Lymph- no cervical lymphadenopathy Lungs- Clear to ausculation bilaterally, normal work of breathing Chest- pacemaker pocket is well healed Heart- Regular rate and rhythm, no murmurs, rubs or gallops, PMI not laterally displaced GI- soft, NT, ND, + BS Extremities- no clubbing, cyanosis, or edema Neuro- strength and sensation are intact  Pacemaker interrogation- reviewed in detail today,  See PACEART report  Assessment and Plan:

## 2012-01-19 NOTE — Assessment & Plan Note (Signed)
Above goal today She does not wish to have medicine adjusted stating that her BP "is better at Dr Dian Situ office" [facility] restriction is advised

## 2012-01-19 NOTE — Assessment & Plan Note (Signed)
Normal pacemaker function See Arita Miss Art report No changes today  Her Kappa pacemaker battery has 14 months estimated longevity.  As the Kappa battery estimate is not always accurate and she is dependant, we will have her begin monthly carelink checks.  She will see Baxter Hire in the pacemaker clinic in 6 months and I will see her in 12 months.

## 2012-02-16 ENCOUNTER — Encounter: Payer: Medicare Other | Admitting: *Deleted

## 2012-02-21 ENCOUNTER — Encounter: Payer: Self-pay | Admitting: *Deleted

## 2012-02-28 ENCOUNTER — Telehealth: Payer: Self-pay | Admitting: Internal Medicine

## 2012-02-28 NOTE — Telephone Encounter (Signed)
Tried to troubleshoot transmitter without success.  Patient was referred to tech support @ [employer].

## 2012-02-28 NOTE — Telephone Encounter (Signed)
New msg Pt wanted to talk to you about transmission that didn't go thru

## 2012-09-04 ENCOUNTER — Encounter: Payer: Self-pay | Admitting: *Deleted

## 2012-09-21 ENCOUNTER — Other Ambulatory Visit: Payer: Self-pay | Admitting: Internal Medicine

## 2012-09-21 ENCOUNTER — Ambulatory Visit (INDEPENDENT_AMBULATORY_CARE_PROVIDER_SITE_OTHER): Payer: Medicare Other | Admitting: *Deleted

## 2012-09-21 ENCOUNTER — Encounter: Payer: Self-pay | Admitting: Internal Medicine

## 2012-09-21 DIAGNOSIS — I472 Ventricular tachycardia, unspecified: Secondary | ICD-10-CM

## 2012-09-21 DIAGNOSIS — I4891 Unspecified atrial fibrillation: Secondary | ICD-10-CM

## 2012-09-21 DIAGNOSIS — I442 Atrioventricular block, complete: Secondary | ICD-10-CM

## 2012-09-21 LAB — PACEMAKER DEVICE OBSERVATION
AL AMPLITUDE: 2.8 mv
AL IMPEDENCE PM: 453 Ohm
AL THRESHOLD: 0.5 v
ATRIAL PACING PM: 33
BAMS-0001: 175 {beats}/min
BATTERY VOLTAGE: 2.6 v
RV LEAD AMPLITUDE: 8 mv
RV LEAD IMPEDENCE PM: 690 Ohm
RV LEAD THRESHOLD: 0.5 v
VENTRICULAR PACING PM: 100

## 2012-09-21 NOTE — Progress Notes (Signed)
Pacer check in clinic  

## 2013-01-16 ENCOUNTER — Other Ambulatory Visit: Payer: Self-pay | Admitting: *Deleted

## 2013-01-16 ENCOUNTER — Encounter: Payer: Self-pay | Admitting: *Deleted

## 2013-01-16 ENCOUNTER — Ambulatory Visit (INDEPENDENT_AMBULATORY_CARE_PROVIDER_SITE_OTHER): Payer: Medicare Other | Admitting: Internal Medicine

## 2013-01-16 ENCOUNTER — Telehealth: Payer: Self-pay | Admitting: Internal Medicine

## 2013-01-16 ENCOUNTER — Encounter: Payer: Self-pay | Admitting: Internal Medicine

## 2013-01-16 ENCOUNTER — Telehealth: Payer: Self-pay | Admitting: *Deleted

## 2013-01-16 VITALS — BP 157/91 | HR 64 | Ht 63.0 in | Wt 156.0 lb

## 2013-01-16 DIAGNOSIS — Z01810 Encounter for preprocedural cardiovascular examination: Secondary | ICD-10-CM

## 2013-01-16 DIAGNOSIS — I442 Atrioventricular block, complete: Secondary | ICD-10-CM

## 2013-01-16 DIAGNOSIS — I4891 Unspecified atrial fibrillation: Secondary | ICD-10-CM

## 2013-01-16 DIAGNOSIS — Z950 Presence of cardiac pacemaker: Secondary | ICD-10-CM

## 2013-01-16 DIAGNOSIS — I10 Essential (primary) hypertension: Secondary | ICD-10-CM

## 2013-01-16 LAB — PACEMAKER DEVICE OBSERVATION
BATTERY VOLTAGE: 2.58 v
BMOD-0003RV: 30
BMOD-0005RV: 95 {beats}/min
BRDY-0002RV: 65 {beats}/min
RV LEAD AMPLITUDE: 5.6 mv
RV LEAD IMPEDENCE PM: 650 Ohm
RV LEAD THRESHOLD: 0.5 v
VENTRICULAR PACING PM: 100

## 2013-01-16 LAB — PROTIME-INR

## 2013-01-16 NOTE — Addendum Note (Signed)
Addended by: Eustace Moore on: 01/16/2013 10:30 AM   Modules accepted: Orders

## 2013-01-16 NOTE — Patient Instructions (Signed)
Your physician recommends that you continue on your current medications as directed. Please refer to the Current Medication list given to you today. You are scheduled for a generator change out on Friday, January 18, 2013 at 10:00 am. Your physician recommends that you have your pre procedure labs today at [facility].

## 2013-01-16 NOTE — Telephone Encounter (Signed)
Generator change out on 01/18/13 @11 :00 am with Dr. Johney Frame dx:end life of PPM 426.0, 427.31 CHECKING PERCERT

## 2013-01-16 NOTE — Progress Notes (Signed)
PCP:Cassandra Pandy, MD  The patient presents today for routine electrophysiology followup.  Since last being seen in our clinic, the patient reports doing well. She remains active and is without concerns today. She has reached ERI 3/14 but is unaware of symptoms with this. Today, she denies symptoms of palpitations, chest pain, shortness of breath, orthopnea, PND, lower extremity edema, dizziness, presyncope, syncope, or neurologic sequela.  The patient feels that she is tolerating medications without difficulties and is otherwise without complaint today.   Past Medical History  Diagnosis Date  . Cardiac pacemaker in situ   . Atrioventricular block, complete   . Hypertension   . Paroxysmal atrial fibrillation   . Esophageal reflux    Past Surgical History  Procedure Laterality Date  . Pacemaker insertion  2005    Medtronic Kappa [id number]    Current Outpatient Prescriptions  Medication Sig Dispense Refill  . aspirin 325 MG tablet Take 325 mg by mouth daily.        . Calcium Carbonate-Vit D-Min 600-400 MG-UNIT TABS Take 2 tablets by mouth daily.        . fish oil-omega-3 fatty acids 1000 MG capsule Take 1 g by mouth 2 (two) times daily.        Marland Kitchen glucosamine-chondroitin 500-400 MG tablet Take 1 tablet by mouth daily.        . metoprolol tartrate (LOPRESSOR) 25 MG tablet Take 25 mg by mouth 2 (two) times daily.        . niacin (NIASPAN) 500 MG CR tablet Take 500 mg by mouth 4 (four) times daily.       . raloxifene (EVISTA) 60 MG tablet Take 60 mg by mouth daily.         No current facility-administered medications for this visit.    No Known Allergies  History   Social History  . Marital Status: Widowed    Spouse Name: N/A    Number of Children: N/A  . Years of Education: N/A   Occupational History  . RETIRED    Social History Main Topics  . Smoking status: Never Smoker   . Smokeless tobacco: Never Used  . Alcohol Use: No  . Drug Use: No  . Sexually Active: Not on file    Other Topics Concern  . Not on file   Social History Narrative  . No narrative on file    Family History  Problem Relation Age of Onset  . Cancer Mother   . Stroke Father      Physical Exam: Filed Vitals:   01/16/13 0908  BP: 157/91  Pulse: 64  Height: 5\' 3"  (1.6 m)  Weight: 156 lb (70.761 kg)    GEN- The patient is well appearing, alert and oriented x 3 today.   Head- normocephalic, atraumatic Eyes-  Sclera clear, conjunctiva pink Ears- hearing intact Oropharynx- clear Neck- supple, no JVP Lymph- no cervical lymphadenopathy Lungs- Clear to ausculation bilaterally, normal work of breathing Chest- pacemaker pocket is well healed Heart- Regular rate and rhythm, no murmurs, rubs or gallops, PMI not laterally displaced GI- soft, NT, ND, + BS Extremities- no clubbing, cyanosis, or edema Neuro- strength and sensation are intact  Pacemaker interrogation- reviewed in detail today,  See PACEART report  Assessment and Plan:  1. Complete heart block Pacemaker at ERI. Will plan generator change either this Friday or Tuesday as schedule allows Risks, benefits, and alteratives to this procedure were discussed at length with the patient who wishes to proceed  2. afib  Stable She has been reluctant to consider anticoagulation Will continue to discuss upon return  3. htn Above goal No changes today.  Will follow.

## 2013-01-16 NOTE — Telephone Encounter (Signed)
Spoke with patient about pacemaker generator change this Friday with Dr Johney Frame.  Pt to arrive at 9:30AM for an 11:30AM case.  Pt to be NPO after midnight Thursday night.  Pt aware to shower Thursday night and Friday morning.  Ok to take medications with water Friday morning.  Pt aware and agrees with plan.

## 2013-01-16 NOTE — Telephone Encounter (Signed)
[facility] Complete UEAV#[id number] exp 03-02-13

## 2013-01-17 ENCOUNTER — Encounter (HOSPITAL_COMMUNITY): Payer: Self-pay | Admitting: Pharmacy Technician

## 2013-01-17 MED ORDER — SODIUM CHLORIDE 0.9 % IR SOLN
80.0000 mg | Status: DC
  Filled 2013-01-17: qty 2

## 2013-01-17 MED ORDER — CEFAZOLIN SODIUM-DEXTROSE 2-3 GM-% IV SOLR
2.0000 g | INTRAVENOUS | Status: DC
  Filled 2013-01-17: qty 50

## 2013-01-18 ENCOUNTER — Ambulatory Visit (HOSPITAL_COMMUNITY)
Admission: RE | Admit: 2013-01-18 | Discharge: 2013-01-18 | Disposition: A | Discharge status: Home / Self Care | Payer: Medicare Other | Source: Ambulatory Visit | Attending: Internal Medicine | Admitting: Internal Medicine

## 2013-01-18 ENCOUNTER — Encounter (HOSPITAL_COMMUNITY)
Admission: RE | Disposition: A | Discharge status: Home / Self Care | Payer: Self-pay | Source: Ambulatory Visit | Attending: Internal Medicine

## 2013-01-18 DIAGNOSIS — I441 Atrioventricular block, second degree: Secondary | ICD-10-CM

## 2013-01-18 DIAGNOSIS — I4891 Unspecified atrial fibrillation: Secondary | ICD-10-CM | POA: Diagnosis present

## 2013-01-18 DIAGNOSIS — Z7982 Long term (current) use of aspirin: Secondary | ICD-10-CM | POA: Insufficient documentation

## 2013-01-18 DIAGNOSIS — Z45018 Encounter for adjustment and management of other part of cardiac pacemaker: Secondary | ICD-10-CM | POA: Insufficient documentation

## 2013-01-18 DIAGNOSIS — Z809 Family history of malignant neoplasm, unspecified: Secondary | ICD-10-CM | POA: Insufficient documentation

## 2013-01-18 DIAGNOSIS — I442 Atrioventricular block, complete: Secondary | ICD-10-CM | POA: Diagnosis present

## 2013-01-18 DIAGNOSIS — Z823 Family history of stroke: Secondary | ICD-10-CM | POA: Insufficient documentation

## 2013-01-18 DIAGNOSIS — K219 Gastro-esophageal reflux disease without esophagitis: Secondary | ICD-10-CM | POA: Insufficient documentation

## 2013-01-18 DIAGNOSIS — Z79899 Other long term (current) drug therapy: Secondary | ICD-10-CM | POA: Insufficient documentation

## 2013-01-18 DIAGNOSIS — I10 Essential (primary) hypertension: Secondary | ICD-10-CM | POA: Insufficient documentation

## 2013-01-18 HISTORY — PX: PACEMAKER GENERATOR CHANGE: SHX5998

## 2013-01-18 HISTORY — PX: PERMANENT PACEMAKER GENERATOR CHANGE: SHX6022

## 2013-01-18 LAB — SURGICAL PCR SCREEN
MRSA, PCR: NEGATIVE
Staphylococcus aureus: NEGATIVE

## 2013-01-18 SURGERY — PERMANENT PACEMAKER GENERATOR CHANGE
Anesthesia: LOCAL | Laterality: Left

## 2013-01-18 MED ORDER — MUPIROCIN 2 % EX OINT
TOPICAL_OINTMENT | Freq: Two times a day (BID) | CUTANEOUS | Status: DC
  Filled 2013-01-18: qty 22

## 2013-01-18 MED ORDER — ONDANSETRON HCL 4 MG/2ML IJ SOLN: 4.0000 mg | Freq: Four times a day (QID) | INTRAMUSCULAR | Status: DC | PRN

## 2013-01-18 MED ORDER — HYDROCODONE-ACETAMINOPHEN 5-325 MG PO TABS: 1.0000 | ORAL_TABLET | ORAL | Status: DC | PRN

## 2013-01-18 MED ORDER — MIDAZOLAM HCL 5 MG/5ML IJ SOLN
INTRAMUSCULAR | Status: AC
  Filled 2013-01-18: qty 5

## 2013-01-18 MED ORDER — HEPARIN (PORCINE) IN NACL 2-0.9 UNIT/ML-% IJ SOLN
INTRAMUSCULAR | Status: AC
  Filled 2013-01-18: qty 500

## 2013-01-18 MED ORDER — SODIUM CHLORIDE 0.9 % IV SOLN
INTRAVENOUS | Status: DC
  Administered 2013-01-18: 11:00:00 via INTRAVENOUS

## 2013-01-18 MED ORDER — MUPIROCIN 2 % EX OINT
TOPICAL_OINTMENT | CUTANEOUS | Status: AC
  Filled 2013-01-18: qty 22

## 2013-01-18 MED ORDER — SODIUM CHLORIDE 0.9 % IJ SOLN: 3.0000 mL | Freq: Two times a day (BID) | INTRAMUSCULAR | Status: DC

## 2013-01-18 MED ORDER — CHLORHEXIDINE GLUCONATE 4 % EX LIQD
60.0000 mL | Freq: Once | CUTANEOUS | Status: DC
  Filled 2013-01-18: qty 60

## 2013-01-18 MED ORDER — ACETAMINOPHEN 325 MG PO TABS: 325.0000 mg | ORAL_TABLET | ORAL | Status: DC | PRN

## 2013-01-18 MED ORDER — LIDOCAINE HCL (PF) 1 % IJ SOLN
INTRAMUSCULAR | Status: AC
  Filled 2013-01-18: qty 60

## 2013-01-18 MED ORDER — SODIUM CHLORIDE 0.9 % IV SOLN: 250.0000 mL | INTRAVENOUS | Status: DC | PRN

## 2013-01-18 MED ORDER — SODIUM CHLORIDE 0.9 % IJ SOLN: 3.0000 mL | INTRAMUSCULAR | Status: DC | PRN

## 2013-01-18 NOTE — Interval H&P Note (Signed)
History and Physical Interval Note:  01/18/2013 10:50 AM  Cassandra Young  has presented today for surgery, with the diagnosis of EOL  The various methods of treatment have been discussed with the patient and family. After consideration of risks, benefits and other options for treatment, the patient has consented to  Procedure(s): PERMANENT PACEMAKER GENERATOR CHANGE (Left) as a surgical intervention .  The patient's history has been reviewed, patient examined, no change in status, stable for surgery.  I have reviewed the patient's chart and labs.  Questions were answered to the patient's satisfaction.     Hillis Range

## 2013-01-18 NOTE — H&P (View-Only) (Signed)
PCP:SASSER,PAUL W, MD  The patient presents today for routine electrophysiology followup.  Since last being seen in our clinic, the patient reports doing well. She remains active and is without concerns today. She has reached ERI 3/14 but is unaware of symptoms with this. Today, she denies symptoms of palpitations, chest pain, shortness of breath, orthopnea, PND, lower extremity edema, dizziness, presyncope, syncope, or neurologic sequela.  The patient feels that she is tolerating medications without difficulties and is otherwise without complaint today.   Past Medical History  Diagnosis Date  . Cardiac pacemaker in situ   . Atrioventricular block, complete   . Hypertension   . Paroxysmal atrial fibrillation   . Esophageal reflux    Past Surgical History  Procedure Laterality Date  . Pacemaker insertion  2005    Medtronic Kappa [id number]    Current Outpatient Prescriptions  Medication Sig Dispense Refill  . aspirin 325 MG tablet Take 325 mg by mouth daily.        . Calcium Carbonate-Vit D-Min 600-400 MG-UNIT TABS Take 2 tablets by mouth daily.        . fish oil-omega-3 fatty acids 1000 MG capsule Take 1 g by mouth 2 (two) times daily.        . glucosamine-chondroitin 500-400 MG tablet Take 1 tablet by mouth daily.        . metoprolol tartrate (LOPRESSOR) 25 MG tablet Take 25 mg by mouth 2 (two) times daily.        . niacin (NIASPAN) 500 MG CR tablet Take 500 mg by mouth 4 (four) times daily.       . raloxifene (EVISTA) 60 MG tablet Take 60 mg by mouth daily.         No current facility-administered medications for this visit.    No Known Allergies  History   Social History  . Marital Status: Widowed    Spouse Name: N/A    Number of Children: N/A  . Years of Education: N/A   Occupational History  . RETIRED    Social History Main Topics  . Smoking status: Never Smoker   . Smokeless tobacco: Never Used  . Alcohol Use: No  . Drug Use: No  . Sexually Active: Not on file    Other Topics Concern  . Not on file   Social History Narrative  . No narrative on file    Family History  Problem Relation Age of Onset  . Cancer Mother   . Stroke Father      Physical Exam: Filed Vitals:   01/16/13 0908  BP: 157/91  Pulse: 64  Height: 5' 3" (1.6 m)  Weight: 156 lb (70.761 kg)    GEN- The patient is well appearing, alert and oriented x 3 today.   Head- normocephalic, atraumatic Eyes-  Sclera clear, conjunctiva pink Ears- hearing intact Oropharynx- clear Neck- supple, no JVP Lymph- no cervical lymphadenopathy Lungs- Clear to ausculation bilaterally, normal work of breathing Chest- pacemaker pocket is well healed Heart- Regular rate and rhythm, no murmurs, rubs or gallops, PMI not laterally displaced GI- soft, NT, ND, + BS Extremities- no clubbing, cyanosis, or edema Neuro- strength and sensation are intact  Pacemaker interrogation- reviewed in detail today,  See PACEART report  Assessment and Plan:  1. Complete heart block Pacemaker at ERI. Will plan generator change either this Friday or Tuesday as schedule allows Risks, benefits, and alteratives to this procedure were discussed at length with the patient who wishes to proceed  2. afib   Stable She has been reluctant to consider anticoagulation Will continue to discuss upon return  3. htn Above goal No changes today.  Will follow. 

## 2013-01-18 NOTE — Op Note (Signed)
SURGEON:  Hillis Range, MD     PREPROCEDURE DIAGNOSES:   1. 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 Paroxsymal Atrial fibrillation.     POSTPROCEDURE DIAGNOSES:   1. 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 Paroxsymal Atrial fibrillation.     PROCEDURES:   1. Pacemaker pulse generator replacement.   2. Skin pocket revision.     INTRODUCTION:  Cassandra Young is a 78 y.o. female with a history of second degree AV block type II with prior complete heart block (transient).  She has done well since her pacemaker was implanted.  She has recently reached ERI battery status.  She presents today for pacemaker pulse generator replacement.       DESCRIPTION OF THE PROCEDURE:  Informed written consent was obtained, and the patient was brought to the electrophysiology lab in the fasting state.  The patient's pacemaker was interrogated today and found to be at elective replacement indicator battery status.  The patient was adequately sedated with intravenous Versed as outlined in the nursing report.  The patient's left chest was prepped and draped in the usual sterile fashion by the EP lab staff.  The skin overlying the existing pacemaker was infiltrated with lidocaine for local analgesia.  A 4-cm incision was made over the pacemaker pocket.  Using a combination of sharp and blunt dissection, the pacemaker was exposed and removed from the body.  The device was disconnected from the leads. A single silk suture was identified and removed which had secured the device to the pectoralis fascia.  There was no foreign matter or debris within the pocket.  The atrial lead was confirmed to be a Medtronic model [id number] (serial number [id number]) lead implanted on 02/27/2004.  The right ventricular lead was confirmed to be a Medtronic model [id number] (serial number [id number]) lead implanted on the same date as the atrial lead (above).  Both leads were  examined and their integrity was confirmed to be intact.  Atrial lead P-waves measured 1.9 mV with impedance of 335 ohms and a threshold of 0.4 V at 0.5 msec.  Right ventricular lead R-waves measured 11.8 mV with impedance of 574 ohms and a threshold of 0.3 V at 0.5 msec.  Both leads were connected to a Medtronic adapt L model ADDDR 1 (serial number [id number]) pacemaker.  The pocket was revised to accommodate this new device.  Electrocautery was required to assure hemostasis.  The pocket was irrigated with copious gentamicin solution. The pacemaker was then placed into the pocket.  The pocket was then closed in 2 layers with 2-0 Vicryl suture over the subcutaneous and subcuticular layers.  Steri-Strips and a sterile dressing were then applied.  There were no early apparent complications.     CONCLUSIONS:   1. Successful pacemaker pulse generator replacement for elective replacement indicator battery status   2. No early apparent complications.     Hillis Range, MD 01/18/2013 12:10 PM

## 2013-01-28 ENCOUNTER — Ambulatory Visit (INDEPENDENT_AMBULATORY_CARE_PROVIDER_SITE_OTHER): Payer: Medicare Other | Admitting: *Deleted

## 2013-01-28 DIAGNOSIS — I442 Atrioventricular block, complete: Secondary | ICD-10-CM

## 2013-01-28 LAB — PACEMAKER DEVICE OBSERVATION
AL AMPLITUDE: 2 mv
AL IMPEDENCE PM: 434 Ohm
AL THRESHOLD: 0.75 v
ATRIAL PACING PM: 31
BAMS-0001: 150 {beats}/min
BATTERY VOLTAGE: 2.79 v
RV LEAD AMPLITUDE: 5.6 mv
RV LEAD IMPEDENCE PM: 628 Ohm
RV LEAD THRESHOLD: 0.5 v
VENTRICULAR PACING PM: 14

## 2013-01-28 NOTE — Progress Notes (Signed)
Wound check-PPM in office. 

## 2013-02-01 ENCOUNTER — Encounter (HOSPITAL_COMMUNITY): Payer: Self-pay | Admitting: *Deleted

## 2013-02-08 ENCOUNTER — Encounter: Payer: Self-pay | Admitting: Internal Medicine

## 2013-04-22 ENCOUNTER — Encounter (HOSPITAL_COMMUNITY): Payer: Self-pay | Admitting: Pharmacy Technician

## 2013-04-30 ENCOUNTER — Encounter (HOSPITAL_COMMUNITY): Payer: Self-pay

## 2013-04-30 ENCOUNTER — Encounter (HOSPITAL_COMMUNITY)
Admission: RE | Admit: 2013-04-30 | Discharge: 2013-04-30 | Disposition: A | Discharge status: Home / Self Care | Payer: Medicare Other | Source: Ambulatory Visit | Attending: Ophthalmology | Admitting: Ophthalmology

## 2013-04-30 DIAGNOSIS — Z01812 Encounter for preprocedural laboratory examination: Secondary | ICD-10-CM | POA: Insufficient documentation

## 2013-04-30 HISTORY — DX: Presence of cardiac pacemaker: Z95.0

## 2013-04-30 LAB — BASIC METABOLIC PANEL WITH GFR
BUN: 14 mg/dL (ref 6–23)
CO2: 29 meq/L (ref 19–32)
Calcium: 10.2 mg/dL (ref 8.4–10.5)
Chloride: 101 meq/L (ref 96–112)
Creatinine, Ser: 1.05 mg/dL (ref 0.50–1.10)
GFR calc Af Amer: 58 mL/min — ABNORMAL LOW
GFR calc non Af Amer: 50 mL/min — ABNORMAL LOW
Glucose, Bld: 99 mg/dL (ref 70–99)
Potassium: 4 meq/L (ref 3.5–5.1)
Sodium: 140 meq/L (ref 135–145)

## 2013-04-30 LAB — HEMOGLOBIN AND HEMATOCRIT, BLOOD
HCT: 42.8 % (ref 36.0–46.0)
Hemoglobin: 14.8 g/dL (ref 12.0–15.0)

## 2013-04-30 NOTE — Patient Instructions (Addendum)
Your procedure is scheduled on: 05/06/2013  Report to [facility] at  1030  AM.  Call this number if you have problems the morning of surgery: [phone]   Do not eat food or drink liquids :After Midnight.      Take these medicines the morning of surgery with A SIP OF WATER: metoprolol   Do not wear jewelry, make-up or nail polish.  Do not wear lotions, powders, or perfumes.  Do not shave 48 hours prior to surgery.  Do not bring valuables to the hospital.  Contacts, dentures or bridgework may not be worn into surgery.  Leave suitcase in the car. After surgery it may be brought to your room.  For patients admitted to the hospital, checkout time is 11:00 AM the day of discharge.   Patients discharged the day of surgery will not be allowed to drive home.  :     Please read over the following fact sheets that you were given: Coughing and Deep Breathing, Surgical Site Infection Prevention, Anesthesia Post-op Instructions and Care and Recovery After Surgery    Cataract A cataract is a clouding of the lens of the eye. When a lens becomes cloudy, vision is reduced based on the degree and nature of the clouding. Many cataracts reduce vision to some degree. Some cataracts make people more near-sighted as they develop. Other cataracts increase glare. Cataracts that are ignored and become worse can sometimes look white. The white color can be seen through the pupil. CAUSES   Aging. However, cataracts may occur at any age, even in newborns.   Certain drugs.   Trauma to the eye.   Certain diseases such as diabetes.   Specific eye diseases such as chronic inflammation inside the eye or a sudden attack of a rare form of glaucoma.   Inherited or acquired medical problems.  SYMPTOMS   Gradual, progressive drop in vision in the affected eye.   Severe, rapid visual loss. This most often happens when trauma is the cause.  DIAGNOSIS  To detect a cataract, an eye doctor examines the lens.  Cataracts are best diagnosed with an exam of the eyes with the pupils enlarged (dilated) by drops.  TREATMENT  For an early cataract, vision may improve by using different eyeglasses or stronger lighting. If that does not help your vision, surgery is the only effective treatment. A cataract needs to be surgically removed when vision loss interferes with your everyday activities, such as driving, reading, or watching TV. A cataract may also have to be removed if it prevents examination or treatment of another eye problem. Surgery removes the cloudy lens and usually replaces it with a substitute lens (intraocular lens, IOL).  At a time when both you and your doctor agree, the cataract will be surgically removed. If you have cataracts in both eyes, only one is usually removed at a time. This allows the operated eye to heal and be out of danger from any possible problems after surgery (such as infection or poor wound healing). In rare cases, a cataract may be doing damage to your eye. In these cases, your caregiver may advise surgical removal right away. The vast majority of people who have cataract surgery have better vision afterward. HOME CARE INSTRUCTIONS  If you are not planning surgery, you may be asked to do the following:  Use different eyeglasses.   Use stronger or brighter lighting.   Ask your eye doctor about reducing your medicine dose or changing medicines if it  is thought that a medicine caused your cataract. Changing medicines does not make the cataract go away on its own.   Become familiar with your surroundings. Poor vision can lead to injury. Avoid bumping into things on the affected side. You are at a higher risk for tripping or falling.   Exercise extreme care when driving or operating machinery.   Wear sunglasses if you are sensitive to bright light or experiencing problems with glare.  SEEK IMMEDIATE MEDICAL CARE IF:   You have a worsening or sudden vision loss.   You notice  redness, swelling, or increasing pain in the eye.   You have a fever.  Document Released: 07/11/2005 Document Revised: 06/30/2011 Document Reviewed: 03/04/2011 [facility] Patient Information 2012 [place].PATIENT INSTRUCTIONS POST-ANESTHESIA  IMMEDIATELY FOLLOWING SURGERY:  Do not drive or operate machinery for the first twenty four hours after surgery.  Do not make any important decisions for twenty four hours after surgery or while taking narcotic pain medications or sedatives.  If you develop intractable nausea and vomiting or a severe headache please notify your doctor immediately.  FOLLOW-UP:  Please make an appointment with your surgeon as instructed. You do not need to follow up with anesthesia unless specifically instructed to do so.  WOUND CARE INSTRUCTIONS (if applicable):  Keep a dry clean dressing on the anesthesia/puncture wound site if there is drainage.  Once the wound has quit draining you may leave it open to air.  Generally you should leave the bandage intact for twenty four hours unless there is drainage.  If the epidural site drains for more than 36-48 hours please call the anesthesia department.  QUESTIONS?:  Please feel free to call your physician or the hospital operator if you have any questions, and they will be happy to assist you.

## 2013-05-03 MED ORDER — LIDOCAINE HCL (PF) 1 % IJ SOLN
INTRAMUSCULAR | Status: AC
  Filled 2013-05-03: qty 2

## 2013-05-03 MED ORDER — TETRACAINE HCL 0.5 % OP SOLN
OPHTHALMIC | Status: AC
  Filled 2013-05-03: qty 2

## 2013-05-03 MED ORDER — LIDOCAINE HCL 3.5 % OP GEL
OPHTHALMIC | Status: AC
  Filled 2013-05-03: qty 1

## 2013-05-03 MED ORDER — PHENYLEPHRINE HCL 2.5 % OP SOLN
OPHTHALMIC | Status: AC
  Filled 2013-05-03: qty 15

## 2013-05-03 MED ORDER — CYCLOPENTOLATE-PHENYLEPHRINE OP SOLN OPTIME - NO CHARGE
OPHTHALMIC | Status: AC
  Filled 2013-05-03: qty 2

## 2013-05-03 MED ORDER — NEOMYCIN-POLYMYXIN-DEXAMETH 3.5-10000-0.1 OP SUSP
OPHTHALMIC | Status: AC
  Filled 2013-05-03: qty 5

## 2013-05-06 ENCOUNTER — Ambulatory Visit (HOSPITAL_COMMUNITY): Payer: Medicare Other | Admitting: Anesthesiology

## 2013-05-06 ENCOUNTER — Encounter: Payer: Medicare Other | Admitting: Internal Medicine

## 2013-05-06 ENCOUNTER — Encounter (HOSPITAL_COMMUNITY)
Admission: RE | Disposition: A | Discharge status: Home / Self Care | Payer: Self-pay | Source: Ambulatory Visit | Attending: Ophthalmology

## 2013-05-06 ENCOUNTER — Encounter (HOSPITAL_COMMUNITY): Payer: Self-pay | Admitting: *Deleted

## 2013-05-06 ENCOUNTER — Ambulatory Visit (HOSPITAL_COMMUNITY)
Admission: RE | Admit: 2013-05-06 | Discharge: 2013-05-06 | Disposition: A | Discharge status: Home / Self Care | Payer: Medicare Other | Source: Ambulatory Visit | Attending: Ophthalmology | Admitting: Ophthalmology

## 2013-05-06 ENCOUNTER — Encounter (HOSPITAL_COMMUNITY): Payer: Medicare Other | Admitting: Anesthesiology

## 2013-05-06 DIAGNOSIS — H2589 Other age-related cataract: Secondary | ICD-10-CM | POA: Insufficient documentation

## 2013-05-06 DIAGNOSIS — I10 Essential (primary) hypertension: Secondary | ICD-10-CM | POA: Insufficient documentation

## 2013-05-06 HISTORY — PX: CATARACT EXTRACTION W/PHACO: SHX586

## 2013-05-06 SURGERY — CATARACT EXTRACTION PHACO AND INTRAOCULAR LENS PLACEMENT (IOC)
Anesthesia: Monitor Anesthesia Care | Site: Eye | Laterality: Right | Wound class: Clean

## 2013-05-06 MED ORDER — LACTATED RINGERS IV SOLN
INTRAVENOUS | Status: DC
  Administered 2013-05-06: 1000 mL via INTRAVENOUS

## 2013-05-06 MED ORDER — EPINEPHRINE HCL 1 MG/ML IJ SOLN
INTRAOCULAR | Status: DC | PRN
  Administered 2013-05-06: 12:00:00

## 2013-05-06 MED ORDER — LIDOCAINE HCL 3.5 % OP GEL
1.0000 "application " | Freq: Once | OPHTHALMIC | Status: AC
  Administered 2013-05-06: 1 "application " via OPHTHALMIC

## 2013-05-06 MED ORDER — POVIDONE-IODINE 5 % OP SOLN
OPHTHALMIC | Status: DC | PRN
  Administered 2013-05-06: 1 "application " via OPHTHALMIC

## 2013-05-06 MED ORDER — TETRACAINE HCL 0.5 % OP SOLN
1.0000 [drp] | OPHTHALMIC | Status: AC
  Administered 2013-05-06 (×3): 1 [drp] via OPHTHALMIC

## 2013-05-06 MED ORDER — PROVISC 10 MG/ML IO SOLN
INTRAOCULAR | Status: DC | PRN
  Administered 2013-05-06: 8.5 mg via INTRAOCULAR

## 2013-05-06 MED ORDER — MIDAZOLAM HCL 2 MG/2ML IJ SOLN
1.0000 mg | INTRAMUSCULAR | Status: DC | PRN
  Administered 2013-05-06: 2 mg via INTRAVENOUS

## 2013-05-06 MED ORDER — CYCLOPENTOLATE-PHENYLEPHRINE 0.2-1 % OP SOLN
1.0000 [drp] | OPHTHALMIC | Status: AC
  Administered 2013-05-06 (×3): 1 [drp] via OPHTHALMIC

## 2013-05-06 MED ORDER — PHENYLEPHRINE HCL 2.5 % OP SOLN
1.0000 [drp] | OPHTHALMIC | Status: AC
  Administered 2013-05-06 (×3): 1 [drp] via OPHTHALMIC

## 2013-05-06 MED ORDER — NEOMYCIN-POLYMYXIN-DEXAMETH 0.1 % OP SUSP: OPHTHALMIC | Status: DC | PRN

## 2013-05-06 MED ORDER — LIDOCAINE HCL (PF) 1 % IJ SOLN
INTRAMUSCULAR | Status: DC | PRN
  Administered 2013-05-06: .4 mL

## 2013-05-06 MED ORDER — BSS IO SOLN
INTRAOCULAR | Status: DC | PRN
  Administered 2013-05-06: 15 mL via INTRAOCULAR

## 2013-05-06 MED ORDER — EPINEPHRINE HCL 1 MG/ML IJ SOLN
INTRAMUSCULAR | Status: AC
  Filled 2013-05-06: qty 1

## 2013-05-06 MED ORDER — MIDAZOLAM HCL 2 MG/2ML IJ SOLN
INTRAMUSCULAR | Status: AC
  Filled 2013-05-06: qty 2

## 2013-05-06 SURGICAL SUPPLY — 30 items
CAPSULAR TENSION RING-AMO (OPHTHALMIC RELATED)
CLOTH BEACON ORANGE TIMEOUT ST (SAFETY) ×2
EYE SHIELD UNIVERSAL CLEAR (GAUZE/BANDAGES/DRESSINGS) ×2
GLOVE BIO SURGEON STRL SZ 6.5 (GLOVE)
GLOVE BIOGEL PI IND STRL 6.5 (GLOVE) ×1
GLOVE BIOGEL PI IND STRL 7.0 (GLOVE) ×1
GLOVE BIOGEL PI IND STRL 7.5 (GLOVE)
GLOVE BIOGEL PI INDICATOR 6.5 (GLOVE) ×1
GLOVE BIOGEL PI INDICATOR 7.0 (GLOVE) ×1
GLOVE BIOGEL PI INDICATOR 7.5 (GLOVE)
GLOVE ECLIPSE 6.5 STRL STRAW (GLOVE)
GLOVE ECLIPSE 7.0 STRL STRAW (GLOVE)
GLOVE ECLIPSE 7.5 STRL STRAW (GLOVE)
GLOVE EXAM NITRILE LRG STRL (GLOVE)
GLOVE EXAM NITRILE MD LF STRL (GLOVE)
GLOVE SKINSENSE NS SZ6.5 (GLOVE)
GLOVE SKINSENSE NS SZ7.0 (GLOVE)
GLOVE SKINSENSE STRL SZ6.5 (GLOVE)
GLOVE SKINSENSE STRL SZ7.0 (GLOVE)
KIT VITRECTOMY (OPHTHALMIC RELATED)
PAD ARMBOARD 7.5X6 YLW CONV (MISCELLANEOUS) ×2
PROC W NO LENS (INTRAOCULAR LENS)
PROC W SPEC LENS (INTRAOCULAR LENS)
RING MALYGIN (MISCELLANEOUS)
SIGHTPATH CAT PROC W REG LENS (Ophthalmic Related) ×2 IMPLANT
SYR TB 1ML LL NO SAFETY (SYRINGE) ×2
TAPE SURG TRANSPORE 1 IN (GAUZE/BANDAGES/DRESSINGS) ×1
TAPE SURGICAL TRANSPORE 1 IN (GAUZE/BANDAGES/DRESSINGS) ×1
VISCOELASTIC ADDITIONAL (OPHTHALMIC RELATED)
WATER STERILE IRR 250ML POUR (IV SOLUTION) ×2

## 2013-05-06 NOTE — H&P (Signed)
I have reviewed the H&P, the patient was re-examined, and I have identified no interval changes in medical condition and plan of care since the history and physical of record  

## 2013-05-06 NOTE — Anesthesia Postprocedure Evaluation (Signed)
  Anesthesia Post-op Note  Patient: Cassandra Young  Procedure(s) Performed: Procedure(s) (LRB): CATARACT EXTRACTION PHACO AND INTRAOCULAR LENS PLACEMENT (IOC) CDE=11.18 (Right)  Patient Location:  Short Stay  Anesthesia Type: MAC  Level of Consciousness: awake  Airway and Oxygen Therapy: Patient Spontanous Breathing  Post-op Pain: none  Post-op Assessment: Post-op Vital signs reviewed, Patient's Cardiovascular Status Stable, Respiratory Function Stable, Patent Airway, No signs of Nausea or vomiting and Pain level controlled  Post-op Vital Signs: Reviewed and stable  Complications: No apparent anesthesia complications

## 2013-05-06 NOTE — Anesthesia Preprocedure Evaluation (Signed)
Anesthesia Evaluation  Patient identified by MRN, date of birth, ID band Patient awake    Reviewed: Allergy & Precautions, H&P , NPO status , Patient's Chart, lab work & pertinent test results  Airway Mallampati: II TM Distance: >3 FB     Dental  (+) Partial Lower and Partial Upper   Pulmonary neg pulmonary ROS,  breath sounds clear to auscultation        Cardiovascular hypertension, + dysrhythmias Atrial Fibrillation + pacemaker Rhythm:Regular Rate:Normal     Neuro/Psych    GI/Hepatic GERD-  ,  Endo/Other    Renal/GU      Musculoskeletal   Abdominal   Peds  Hematology   Anesthesia Other Findings   Reproductive/Obstetrics                           Anesthesia Physical Anesthesia Plan  ASA: III  Anesthesia Plan: MAC   Post-op Pain Management:    Induction: Intravenous  Airway Management Planned: Nasal Cannula  Additional Equipment:   Intra-op Plan:   Post-operative Plan:   Informed Consent: I have reviewed the patients History and Physical, chart, labs and discussed the procedure including the risks, benefits and alternatives for the proposed anesthesia with the patient or authorized representative who has indicated his/her understanding and acceptance.     Plan Discussed with:   Anesthesia Plan Comments:         Anesthesia Quick Evaluation  

## 2013-05-06 NOTE — Op Note (Signed)
Date of Admission: 05/06/2013  Date of Surgery: 05/06/2013  Pre-Op Dx: Cataract  Right  Eye  Post-Op Dx: Combined Cataract  Right  Eye,  Dx Code 366.19  Surgeon: Gemma Payor, M.D.  Assistants: None  Anesthesia: Topical with MAC  Indications: Painless, progressive loss of vision with compromise of daily activities.  Surgery: Cataract Extraction with Intraocular lens Implant Right Eye  Discription: The patient had dilating drops and viscous lidocaine placed into the right eye in the pre-op holding area. After transfer to the operating room, a time out was performed. The patient was then prepped and draped. Beginning with a 75 degree blade a paracentesis port was made at the surgeon's 2 o'clock position. The anterior chamber was then filled with 1% non-preserved lidocaine. This was followed by filling the anterior chamber with Provisc.  A 2.2mm keratome blade was used to make a clear corneal incision at the temporal limbus.  A bent cystatome needle was used to create a continuous tear capsulotomy. Hydrodissection was performed with balanced salt solution on a Fine canula. The lens nucleus was then removed using the phacoemulsification handpiece. Residual cortex was removed with the I&A handpiece. The anterior chamber and capsular bag were refilled with Provisc. A posterior chamber intraocular lens was placed into the capsular bag with it's injector. The implant was positioned with the Kuglan hook. The Provisc was then removed from the anterior chamber and capsular bag with the I&A handpiece. Stromal hydration of the main incision and paracentesis port was performed with BSS on a Fine canula. The wounds were tested for leak which was negative. The patient tolerated the procedure well. There were no operative complications. The patient was then transferred to the recovery room in stable condition.  Complications: None  Specimen: None  EBL: None  Prosthetic device: B&L enVista, MX60, power 18.5D,  SN [id number].

## 2013-05-06 NOTE — Anesthesia Procedure Notes (Signed)
Procedure Name: MAC Date/Time: 05/06/2013 11:31 AM Performed by: Franco Nones Pre-anesthesia Checklist: Patient identified, Emergency Drugs available, Suction available, Timeout performed and Patient being monitored Patient Re-evaluated:Patient Re-evaluated prior to inductionOxygen Delivery Method: Nasal Cannula

## 2013-05-06 NOTE — Transfer of Care (Signed)
Immediate Anesthesia Transfer of Care Note  Patient: Cassandra Young  Procedure(s) Performed: Procedure(s) (LRB): CATARACT EXTRACTION PHACO AND INTRAOCULAR LENS PLACEMENT (IOC) CDE=11.18 (Right)  Patient Location: Shortstay  Anesthesia Type: MAC  Level of Consciousness: awake  Airway & Oxygen Therapy: Patient Spontanous Breathing   Post-op Assessment: Report given to PACU RN, Post -op Vital signs reviewed and stable and Patient moving all extremities  Post vital signs: Reviewed and stable  Complications: No apparent anesthesia complications

## 2013-05-07 ENCOUNTER — Encounter (HOSPITAL_COMMUNITY): Payer: Self-pay | Admitting: Ophthalmology

## 2013-05-07 MED FILL — Neomycin-Polymyxin-Dexamethasone Ophth Susp 0.1%: OPHTHALMIC | Qty: 5 | Status: AC

## 2013-05-08 ENCOUNTER — Encounter (HOSPITAL_COMMUNITY): Payer: Self-pay | Admitting: Ophthalmology

## 2013-05-08 MED ORDER — NEOMYCIN-POLYMYXIN-DEXAMETH 3.5-10000-0.1 OP SUSP
OPHTHALMIC | Status: DC | PRN
  Administered 2013-05-06: 1 [drp] via OPHTHALMIC

## 2013-05-16 ENCOUNTER — Encounter (HOSPITAL_COMMUNITY): Payer: Self-pay | Admitting: Pharmacy Technician

## 2013-05-21 MED ORDER — FENTANYL CITRATE 0.05 MG/ML IJ SOLN: 25.0000 ug | INTRAMUSCULAR | Status: DC | PRN

## 2013-05-21 MED ORDER — ONDANSETRON HCL 4 MG/2ML IJ SOLN: 4.0000 mg | Freq: Once | INTRAMUSCULAR | Status: AC | PRN

## 2013-05-22 ENCOUNTER — Encounter (HOSPITAL_COMMUNITY)
Admission: RE | Admit: 2013-05-22 | Discharge: 2013-05-22 | Disposition: A | Discharge status: Home / Self Care | Payer: Medicare Other | Source: Ambulatory Visit | Attending: Ophthalmology | Admitting: Ophthalmology

## 2013-05-29 MED ORDER — LIDOCAINE HCL (PF) 1 % IJ SOLN
INTRAMUSCULAR | Status: AC
  Filled 2013-05-29: qty 2

## 2013-05-29 MED ORDER — TETRACAINE HCL 0.5 % OP SOLN
OPHTHALMIC | Status: AC
  Filled 2013-05-29: qty 2

## 2013-05-29 MED ORDER — CYCLOPENTOLATE-PHENYLEPHRINE OP SOLN OPTIME - NO CHARGE
OPHTHALMIC | Status: AC
  Filled 2013-05-29: qty 2

## 2013-05-29 MED ORDER — NEOMYCIN-POLYMYXIN-DEXAMETH 3.5-10000-0.1 OP SUSP
OPHTHALMIC | Status: AC
  Filled 2013-05-29: qty 5

## 2013-05-29 MED ORDER — PHENYLEPHRINE HCL 2.5 % OP SOLN
OPHTHALMIC | Status: AC
  Filled 2013-05-29: qty 15

## 2013-05-29 MED ORDER — LIDOCAINE HCL 3.5 % OP GEL
OPHTHALMIC | Status: AC
  Filled 2013-05-29: qty 1

## 2013-05-30 ENCOUNTER — Encounter (HOSPITAL_COMMUNITY): Payer: Medicare Other | Admitting: Anesthesiology

## 2013-05-30 ENCOUNTER — Ambulatory Visit (HOSPITAL_COMMUNITY)
Admission: RE | Admit: 2013-05-30 | Discharge: 2013-05-30 | Disposition: A | Discharge status: Home / Self Care | Payer: Medicare Other | Source: Ambulatory Visit | Attending: Ophthalmology | Admitting: Ophthalmology

## 2013-05-30 ENCOUNTER — Ambulatory Visit (HOSPITAL_COMMUNITY): Payer: Medicare Other | Admitting: Anesthesiology

## 2013-05-30 ENCOUNTER — Encounter (HOSPITAL_COMMUNITY)
Admission: RE | Disposition: A | Discharge status: Home / Self Care | Payer: Self-pay | Source: Ambulatory Visit | Attending: Ophthalmology

## 2013-05-30 ENCOUNTER — Encounter (HOSPITAL_COMMUNITY): Payer: Self-pay | Admitting: Ophthalmology

## 2013-05-30 DIAGNOSIS — H2589 Other age-related cataract: Secondary | ICD-10-CM | POA: Insufficient documentation

## 2013-05-30 DIAGNOSIS — I10 Essential (primary) hypertension: Secondary | ICD-10-CM | POA: Insufficient documentation

## 2013-05-30 HISTORY — PX: CATARACT EXTRACTION W/PHACO: SHX586

## 2013-05-30 SURGERY — CATARACT EXTRACTION PHACO AND INTRAOCULAR LENS PLACEMENT (IOC)
Anesthesia: Monitor Anesthesia Care | Site: Eye | Laterality: Left | Wound class: Clean

## 2013-05-30 MED ORDER — PROVISC 10 MG/ML IO SOLN
INTRAOCULAR | Status: DC | PRN
  Administered 2013-05-30: 0.85 mL via INTRAOCULAR

## 2013-05-30 MED ORDER — MIDAZOLAM HCL 2 MG/2ML IJ SOLN
1.0000 mg | INTRAMUSCULAR | Status: DC | PRN
  Administered 2013-05-30: 2 mg via INTRAVENOUS

## 2013-05-30 MED ORDER — LACTATED RINGERS IV SOLN
INTRAVENOUS | Status: DC
  Administered 2013-05-30: 1000 mL via INTRAVENOUS

## 2013-05-30 MED ORDER — LIDOCAINE 3.5 % OP GEL OPTIME - NO CHARGE
OPHTHALMIC | Status: DC | PRN
  Administered 2013-05-30: 1 [drp] via OPHTHALMIC

## 2013-05-30 MED ORDER — TETRACAINE HCL 0.5 % OP SOLN
1.0000 [drp] | OPHTHALMIC | Status: AC
  Administered 2013-05-30 (×3): 1 [drp] via OPHTHALMIC

## 2013-05-30 MED ORDER — CYCLOPENTOLATE-PHENYLEPHRINE 0.2-1 % OP SOLN
1.0000 [drp] | OPHTHALMIC | Status: AC
  Administered 2013-05-30 (×3): 1 [drp] via OPHTHALMIC

## 2013-05-30 MED ORDER — EPINEPHRINE HCL 1 MG/ML IJ SOLN
INTRAMUSCULAR | Status: AC
  Filled 2013-05-30: qty 1

## 2013-05-30 MED ORDER — ONDANSETRON HCL 4 MG/2ML IJ SOLN: 4.0000 mg | Freq: Once | INTRAMUSCULAR | Status: DC | PRN

## 2013-05-30 MED ORDER — EPINEPHRINE HCL 1 MG/ML IJ SOLN
INTRAOCULAR | Status: DC | PRN
  Administered 2013-05-30: 10:00:00

## 2013-05-30 MED ORDER — FENTANYL CITRATE 0.05 MG/ML IJ SOLN: 25.0000 ug | INTRAMUSCULAR | Status: DC | PRN

## 2013-05-30 MED ORDER — LIDOCAINE HCL 3.5 % OP GEL
1.0000 "application " | Freq: Once | OPHTHALMIC | Status: AC
  Administered 2013-05-30: 1 "application " via OPHTHALMIC

## 2013-05-30 MED ORDER — PHENYLEPHRINE HCL 2.5 % OP SOLN
1.0000 [drp] | OPHTHALMIC | Status: AC
  Administered 2013-05-30 (×3): 1 [drp] via OPHTHALMIC

## 2013-05-30 MED ORDER — FENTANYL CITRATE 0.05 MG/ML IJ SOLN
INTRAMUSCULAR | Status: AC
  Filled 2013-05-30: qty 2

## 2013-05-30 MED ORDER — LIDOCAINE HCL (PF) 1 % IJ SOLN
INTRAMUSCULAR | Status: DC | PRN
  Administered 2013-05-30: .3 mL

## 2013-05-30 MED ORDER — FENTANYL CITRATE 0.05 MG/ML IJ SOLN
25.0000 ug | INTRAMUSCULAR | Status: AC
  Administered 2013-05-30 (×2): 25 ug via INTRAVENOUS

## 2013-05-30 MED ORDER — POVIDONE-IODINE 5 % OP SOLN
OPHTHALMIC | Status: DC | PRN
  Administered 2013-05-30: 1 "application " via OPHTHALMIC

## 2013-05-30 MED ORDER — MIDAZOLAM HCL 2 MG/2ML IJ SOLN
INTRAMUSCULAR | Status: AC
  Filled 2013-05-30: qty 2

## 2013-05-30 MED ORDER — BSS IO SOLN
INTRAOCULAR | Status: DC | PRN
  Administered 2013-05-30: 15 mL via INTRAOCULAR

## 2013-05-30 MED ORDER — LACTATED RINGERS IV SOLN
INTRAVENOUS | Status: DC | PRN
  Administered 2013-05-30: 09:00:00 via INTRAVENOUS

## 2013-05-30 MED ORDER — NEOMYCIN-POLYMYXIN-DEXAMETH 3.5-10000-0.1 OP SUSP
OPHTHALMIC | Status: DC | PRN
  Administered 2013-05-30: 1 [drp] via OPHTHALMIC

## 2013-05-30 SURGICAL SUPPLY — 30 items
CAPSULAR TENSION RING-AMO (OPHTHALMIC RELATED)
CLOTH BEACON ORANGE TIMEOUT ST (SAFETY) ×2
EYE SHIELD UNIVERSAL CLEAR (GAUZE/BANDAGES/DRESSINGS) ×2
GLOVE BIO SURGEON STRL SZ 6.5 (GLOVE) ×4
GLOVE BIOGEL PI IND STRL 6.5 (GLOVE)
GLOVE BIOGEL PI IND STRL 7.0 (GLOVE)
GLOVE BIOGEL PI IND STRL 7.5 (GLOVE)
GLOVE BIOGEL PI INDICATOR 6.5 (GLOVE)
GLOVE BIOGEL PI INDICATOR 7.0 (GLOVE)
GLOVE BIOGEL PI INDICATOR 7.5 (GLOVE)
GLOVE ECLIPSE 6.5 STRL STRAW (GLOVE)
GLOVE ECLIPSE 7.0 STRL STRAW (GLOVE)
GLOVE ECLIPSE 7.5 STRL STRAW (GLOVE)
GLOVE EXAM NITRILE LRG STRL (GLOVE)
GLOVE EXAM NITRILE MD LF STRL (GLOVE)
GLOVE SKINSENSE NS SZ6.5 (GLOVE)
GLOVE SKINSENSE NS SZ7.0 (GLOVE)
GLOVE SKINSENSE STRL SZ6.5 (GLOVE)
GLOVE SKINSENSE STRL SZ7.0 (GLOVE)
KIT VITRECTOMY (OPHTHALMIC RELATED)
PAD ARMBOARD 7.5X6 YLW CONV (MISCELLANEOUS) ×2
PROC W NO LENS (INTRAOCULAR LENS)
PROC W SPEC LENS (INTRAOCULAR LENS)
RING MALYGIN (MISCELLANEOUS)
SIGHTPATH CAT PROC W REG LENS (Ophthalmic Related) ×2 IMPLANT
SYR TB 1ML LL NO SAFETY (SYRINGE) ×2
TAPE SURG TRANSPORE 1 IN (GAUZE/BANDAGES/DRESSINGS) ×1
TAPE SURGICAL TRANSPORE 1 IN (GAUZE/BANDAGES/DRESSINGS) ×1
VISCOELASTIC ADDITIONAL (OPHTHALMIC RELATED)
WATER STERILE IRR 250ML POUR (IV SOLUTION) ×2

## 2013-05-30 NOTE — Preoperative (Signed)
Beta Blockers   Reason not to administer Beta Blockers:Not Applicable 

## 2013-05-30 NOTE — Anesthesia Postprocedure Evaluation (Signed)
  Anesthesia Post-op Note  Patient: Cassandra Young  Procedure(s) Performed: Procedure(s) with comments: CATARACT EXTRACTION PHACO AND INTRAOCULAR LENS PLACEMENT (IOC) (Left) - CDE:13.93  Patient Location: Short Stay  Anesthesia Type:MAC  Level of Consciousness: awake, alert , oriented and patient cooperative  Airway and Oxygen Therapy: Patient Spontanous Breathing  Post-op Pain: none  Post-op Assessment: Post-op Vital signs reviewed, Patient's Cardiovascular Status Stable, Respiratory Function Stable, Patent Airway, No signs of Nausea or vomiting and Pain level controlled  Post-op Vital Signs: Reviewed and stable  Complications: No apparent anesthesia complications

## 2013-05-30 NOTE — Transfer of Care (Signed)
Immediate Anesthesia Transfer of Care Note  Patient: Cassandra Young  Procedure(s) Performed: Procedure(s) with comments: CATARACT EXTRACTION PHACO AND INTRAOCULAR LENS PLACEMENT (IOC) (Left) - CDE:13.93  Patient Location: Short Stay  Anesthesia Type:MAC  Level of Consciousness: awake, alert , oriented and patient cooperative  Airway & Oxygen Therapy: Patient Spontanous Breathing  Post-op Assessment: Report given to PACU RN and Post -op Vital signs reviewed and stable  Post vital signs: Reviewed and stable  Complications: No apparent anesthesia complications

## 2013-05-30 NOTE — H&P (Signed)
I have reviewed the H&P, the patient was re-examined, and I have identified no interval changes in medical condition and plan of care since the history and physical of record  

## 2013-05-30 NOTE — Anesthesia Procedure Notes (Signed)
Procedure Name: MAC Date/Time: 05/30/2013 9:53 AM Performed by: Pernell Dupre, Christon Parada A Pre-anesthesia Checklist: Patient identified, Timeout performed, Emergency Drugs available, Suction available and Patient being monitored Oxygen Delivery Method: Nasal cannula

## 2013-05-30 NOTE — Anesthesia Preprocedure Evaluation (Signed)
Anesthesia Evaluation  Patient identified by MRN, date of birth, ID band Patient awake    Reviewed: Allergy & Precautions, H&P , NPO status , Patient's Chart, lab work & pertinent test results  Airway Mallampati: II TM Distance: >3 FB     Dental  (+) Partial Lower and Partial Upper   Pulmonary neg pulmonary ROS,  breath sounds clear to auscultation        Cardiovascular hypertension, + dysrhythmias Atrial Fibrillation + pacemaker Rhythm:Regular Rate:Normal     Neuro/Psych    GI/Hepatic GERD-  ,  Endo/Other    Renal/GU      Musculoskeletal   Abdominal   Peds  Hematology   Anesthesia Other Findings   Reproductive/Obstetrics                           Anesthesia Physical Anesthesia Plan  ASA: III  Anesthesia Plan: MAC   Post-op Pain Management:    Induction: Intravenous  Airway Management Planned: Nasal Cannula  Additional Equipment:   Intra-op Plan:   Post-operative Plan:   Informed Consent: I have reviewed the patients History and Physical, chart, labs and discussed the procedure including the risks, benefits and alternatives for the proposed anesthesia with the patient or authorized representative who has indicated his/her understanding and acceptance.     Plan Discussed with:   Anesthesia Plan Comments:         Anesthesia Quick Evaluation

## 2013-05-30 NOTE — Op Note (Signed)
Date of Admission: 05/30/2013  Date of Surgery: 05/30/2013  Pre-Op Dx: Cataract  Left  Eye  Post-Op Dx: Cataract  Left  Eye,  Dx Code 366.19  Surgeon: Gemma Payor, M.D.  Assistants: None  Anesthesia: Topical with MAC  Indications: Painless, progressive loss of vision with compromise of daily activities.  Surgery: Cataract Extraction with Intraocular lens Implant Left Eye  Discription: The patient had dilating drops and viscous lidocaine placed into the left eye in the pre-op holding area. After transfer to the operating room, a time out was performed. The patient was then prepped and draped. Beginning with a 75 degree blade a paracentesis port was made at the surgeon's 2 o'clock position. The anterior chamber was then filled with 1% non-preserved lidocaine. This was followed by filling the anterior chamber with Provisc. A 2.10mm keratome blade was used to make a clear corneal incision at the temporal limbus. A bent cystatome needle was used to create a continuous tear capsulotomy. Hydrodissection was performed with balanced salt solution on a Fine canula. The lens nucleus was then removed using the phacoemulsification handpiece. Residual cortex was removed with the I&A handpiece. The anterior chamber and capsular bag were refilled with Provisc. A posterior chamber intraocular lens was placed into the capsular bag with it's injector. The implant was positioned with the Kuglan hook. The Provisc was then removed from the anterior chamber and capsular bag with the I&A handpiece. Stromal hydration of the main incision and paracentesis port was performed with BSS on a Fine canula. The wounds were tested for leak which was negative. The patient tolerated the procedure well. There were no operative complications. The patient was then transferred to the recovery room in stable condition.  Complications: None  Specimen: None  EBL: None  Prosthetic device: B&L enVista, MX60, power 18.5D, SN  [id number].

## 2013-05-31 ENCOUNTER — Encounter (HOSPITAL_COMMUNITY): Payer: Self-pay | Admitting: Ophthalmology

## 2013-06-27 ENCOUNTER — Encounter: Payer: Self-pay | Admitting: Internal Medicine

## 2013-06-27 ENCOUNTER — Ambulatory Visit (INDEPENDENT_AMBULATORY_CARE_PROVIDER_SITE_OTHER): Payer: Medicare Other | Admitting: Internal Medicine

## 2013-06-27 VITALS — BP 151/86 | HR 81 | Ht 63.0 in | Wt 159.0 lb

## 2013-06-27 DIAGNOSIS — I10 Essential (primary) hypertension: Secondary | ICD-10-CM

## 2013-06-27 DIAGNOSIS — Z950 Presence of cardiac pacemaker: Secondary | ICD-10-CM

## 2013-06-27 DIAGNOSIS — Z7901 Long term (current) use of anticoagulants: Secondary | ICD-10-CM

## 2013-06-27 DIAGNOSIS — I4891 Unspecified atrial fibrillation: Secondary | ICD-10-CM

## 2013-06-27 DIAGNOSIS — I442 Atrioventricular block, complete: Secondary | ICD-10-CM

## 2013-06-27 LAB — MDC_IDC_ENUM_SESS_TYPE_INCLINIC
Battery Impedance: 100 Ohm
Battery Remaining Longevity: 168 mo
Battery Voltage: 2.79 V
Brady Statistic AP VP Percent: 7 %
Brady Statistic AP VS Percent: 30 %
Brady Statistic AS VP Percent: 10 %
Brady Statistic AS VS Percent: 53 %
Date Time Interrogation Session: 20141204115200
Lead Channel Impedance Value: 423 Ohm
Lead Channel Impedance Value: 669 Ohm
Lead Channel Pacing Threshold Amplitude: 0.5 V
Lead Channel Pacing Threshold Amplitude: 0.75 V
Lead Channel Pacing Threshold Pulse Width: 0.4 ms
Lead Channel Pacing Threshold Pulse Width: 0.4 ms
Lead Channel Sensing Intrinsic Amplitude: 2 mV
Lead Channel Sensing Intrinsic Amplitude: 5.6 mV
Lead Channel Setting Pacing Amplitude: 2 V
Lead Channel Setting Pacing Amplitude: 2.5 V
Lead Channel Setting Pacing Pulse Width: 0.4 ms
Lead Channel Setting Sensing Sensitivity: 2.8 mV

## 2013-06-27 MED ORDER — APIXABAN 5 MG PO TABS: 5.0000 mg | ORAL_TABLET | Freq: Two times a day (BID) | ORAL | Status: DC

## 2013-06-27 NOTE — Addendum Note (Signed)
Addended by: Lesle Chris on: 06/27/2013 12:24 PM   Modules accepted: Orders, Medications

## 2013-06-27 NOTE — Progress Notes (Signed)
PCP:Estanislado Pandy, MD  The patient presents today for routine electrophysiology followup.  Since her pacemaker generator change, the patient reports doing well. She remains active and is without concerns today. Today, she denies symptoms of palpitations, chest pain, shortness of breath, orthopnea, PND, lower extremity edema, dizziness, presyncope, syncope, or neurologic sequela.  The patient feels that she is tolerating medications without difficulties and is otherwise without complaint today.   Past Medical History  Diagnosis Date  . Atrioventricular block, complete     s/p PPM implant originally in 2005 with generator change 2014 (MDT ADDR1 by Dr Johney Frame)  . Hypertension   . Paroxysmal atrial fibrillation   . Esophageal reflux   . Pacemaker    Past Surgical History  Procedure Laterality Date  . Pacemaker insertion  2005    Medtronic Kappa [id number]  . Pacemaker generator change  01/18/2013    MDT ADDR1 implanted by Dr Johney Frame  . Cataract extraction w/phaco Right 05/06/2013    Procedure: CATARACT EXTRACTION PHACO AND INTRAOCULAR LENS PLACEMENT (IOC) CDE=11.18;  Surgeon: Gemma Payor, MD;  Location: [facility];  Service: Ophthalmology;  Laterality: Right;  . Cataract extraction w/phaco Left 05/30/2013    Procedure: CATARACT EXTRACTION PHACO AND INTRAOCULAR LENS PLACEMENT (IOC);  Surgeon: Gemma Payor, MD;  Location: [facility];  Service: Ophthalmology;  Laterality: Left;  CDE:13.93    Current Outpatient Prescriptions  Medication Sig Dispense Refill  . aspirin 325 MG tablet Take 325 mg by mouth daily.        . Calcium Carbonate-Vit D-Min 600-400 MG-UNIT TABS Take 2 tablets by mouth daily.        . fish oil-omega-3 fatty acids 1000 MG capsule Take 1 g by mouth 2 (two) times daily.       Marland Kitchen glucosamine-chondroitin 500-400 MG tablet Take 1 tablet by mouth daily.        . metoprolol tartrate (LOPRESSOR) 25 MG tablet Take 25 mg by mouth 2 (two) times daily.        . niacin (NIASPAN) 500 MG CR tablet Take 500  mg by mouth 4 (four) times daily.       . raloxifene (EVISTA) 60 MG tablet Take 60 mg by mouth daily.         No current facility-administered medications for this visit.    No Known Allergies  History   Social History  . Marital Status: Widowed    Spouse Name: N/A    Number of Children: N/A  . Years of Education: N/A   Occupational History  . RETIRED    Social History Main Topics  . Smoking status: Never Smoker   . Smokeless tobacco: Never Used  . Alcohol Use: No  . Drug Use: No  . Sexual Activity: Yes    Birth Control/ Protection: None   Other Topics Concern  . Not on file   Social History Narrative  . No narrative on file    Family History  Problem Relation Age of Onset  . Cancer Mother   . Stroke Father      Physical Exam: Filed Vitals:   06/27/13 1127  BP: 151/86  Pulse: 81  Height: 5\' 3"  (1.6 m)  Weight: 159 lb (72.122 kg)    GEN- The patient is well appearing, alert and oriented x 3 today.   Head- normocephalic, atraumatic Eyes-  Sclera clear, conjunctiva pink Ears- hearing intact Oropharynx- clear Neck- supple, no JVP Lymph- no cervical lymphadenopathy Lungs- Clear to ausculation bilaterally, normal work of breathing Chest- pacemaker  pocket is well healed Heart- Regular rate and rhythm, no murmurs, rubs or gallops, PMI not laterally displaced GI- soft, NT, ND, + BS Extremities- no clubbing, cyanosis, or edema Neuro- strength and sensation are intact  Pacemaker interrogation- reviewed in detail today,  See PACEART report  Assessment and Plan:  1. Complete heart block Normal pacemaker function See Pace Art report No changes today  2. Paroxysmal afib Stable Today, I discussed coumadin and novel anticoagulants including pradaxa, xarelto, and eliquis today as indicated for risk reduction in stroke and systemic emboli with nonvalvular atrial fibrillation.  Risks, benefits, and alternatives to each of these drugs were discussed at length  today.  We discussed AVERROES data.  She would like to start eliquis.  I will stop ASA and start eliquis 5mg  BID.  We will obtain a BMET and CBC today. I will have her follow-up with Misty Stanley in our anticoagulation clinic in 6 weeks.  I would like for Misty Stanley to see her twice per year.  3. HTN Stable No change required today  [facility] Return to see me in 1 year

## 2013-06-27 NOTE — Patient Instructions (Signed)
   Stop Aspirin  Begin Eliquis 5mg  twice a day - new sent to pharm Continue all other medications.   Labs for BMET, CBC Office will contact with results via phone or letter.   Follow up in 6 weeks with Misty Stanley (anticoagulation nurse) Continue [facility] remote checks Your physician wants you to follow up in:  1 year.  You will receive a reminder letter in the mail one-two months in advance.  If you don't receive a letter, please call our office to schedule the follow up appointment - [place]

## 2013-07-05 ENCOUNTER — Encounter: Payer: Self-pay | Admitting: Internal Medicine

## 2013-08-08 ENCOUNTER — Encounter: Payer: Self-pay | Admitting: Internal Medicine

## 2013-08-09 ENCOUNTER — Ambulatory Visit (INDEPENDENT_AMBULATORY_CARE_PROVIDER_SITE_OTHER): Payer: Medicare Other | Admitting: *Deleted

## 2013-08-09 DIAGNOSIS — I4891 Unspecified atrial fibrillation: Secondary | ICD-10-CM

## 2013-08-09 NOTE — Patient Instructions (Signed)
1 month Eliquis follow up:  Pt was started on Eliquis for atrial fib on 06/27/13.    Reviewed patients medication list.  Pt is not currently on any combined P-gp and strong CYP3A4 inhibitors/inducers (ketoconazole, traconazole, ritonavir, carbamazepine, phenytoin, rifampin, St. John's wort).  Reviewed labs from 08/04/13.  SCr 1.02, Weight 158, CrCl 53.09.  Dose  Is appropriate based on CrCl.   Hgb and HCT 11.4/34.4.  A full discussion of the nature of anticoagulants has been carried out.  A benefit/risk analysis has been presented to the patient, so that they understand the justification for choosing anticoagulation with Eliquis at this time.  The need for compliance is stressed.  Pt is aware to take the medication twice daily.  Side effects of potential bleeding are discussed, including unusual colored urine or stools, coughing up blood or coffee ground emesis, nose bleeds or serious fall or head trauma.  Discussed signs and symptoms of stroke. The patient should avoid any OTC items containing aspirin or ibuprofen.  Avoid alcohol consumption.   Call if any signs of abnormal bleeding.  Discussed financial obligations and resolved any difficulty in obtaining medication.  Next lab test test in 6 months.

## 2013-09-30 ENCOUNTER — Ambulatory Visit (INDEPENDENT_AMBULATORY_CARE_PROVIDER_SITE_OTHER): Payer: Medicare Other | Admitting: *Deleted

## 2013-09-30 DIAGNOSIS — Z950 Presence of cardiac pacemaker: Secondary | ICD-10-CM

## 2013-09-30 DIAGNOSIS — I442 Atrioventricular block, complete: Secondary | ICD-10-CM

## 2013-10-05 LAB — MDC_IDC_ENUM_SESS_TYPE_REMOTE
Battery Impedance: 100 Ohm
Battery Remaining Longevity: 155 mo
Battery Voltage: 2.79 V
Brady Statistic AP VP Percent: 4 %
Brady Statistic AP VS Percent: 27 %
Brady Statistic AS VP Percent: 5 %
Brady Statistic AS VS Percent: 64 %
Date Time Interrogation Session: 20150309140532
Lead Channel Impedance Value: 429 Ohm
Lead Channel Impedance Value: 622 Ohm
Lead Channel Pacing Threshold Amplitude: 0.5 V
Lead Channel Pacing Threshold Amplitude: 0.625 V
Lead Channel Pacing Threshold Pulse Width: 0.4 ms
Lead Channel Pacing Threshold Pulse Width: 0.4 ms
Lead Channel Sensing Intrinsic Amplitude: 1 mV
Lead Channel Sensing Intrinsic Amplitude: 5.6 mV
Lead Channel Setting Pacing Amplitude: 2 V
Lead Channel Setting Pacing Amplitude: 2.5 V
Lead Channel Setting Pacing Pulse Width: 0.4 ms
Lead Channel Setting Sensing Sensitivity: 2.8 mV

## 2013-10-23 ENCOUNTER — Encounter: Payer: Self-pay | Admitting: *Deleted

## 2013-11-01 ENCOUNTER — Encounter: Payer: Self-pay | Admitting: Internal Medicine

## 2013-12-23 ENCOUNTER — Other Ambulatory Visit: Payer: Self-pay | Admitting: *Deleted

## 2014-01-01 ENCOUNTER — Ambulatory Visit (INDEPENDENT_AMBULATORY_CARE_PROVIDER_SITE_OTHER): Payer: Medicare Other | Admitting: *Deleted

## 2014-01-01 DIAGNOSIS — I442 Atrioventricular block, complete: Secondary | ICD-10-CM

## 2014-01-01 LAB — MDC_IDC_ENUM_SESS_TYPE_REMOTE
Battery Impedance: 109 Ohm
Battery Remaining Longevity: 152 mo
Battery Voltage: 2.78 V
Brady Statistic AP VP Percent: 3 %
Brady Statistic AP VS Percent: 31 %
Brady Statistic AS VP Percent: 3 %
Brady Statistic AS VS Percent: 64 %
Date Time Interrogation Session: 20150610143502
Lead Channel Impedance Value: 453 Ohm
Lead Channel Impedance Value: 682 Ohm
Lead Channel Pacing Threshold Amplitude: 0.5 V
Lead Channel Pacing Threshold Amplitude: 0.75 V
Lead Channel Pacing Threshold Pulse Width: 0.4 ms
Lead Channel Pacing Threshold Pulse Width: 0.4 ms
Lead Channel Sensing Intrinsic Amplitude: 0.7 mV
Lead Channel Sensing Intrinsic Amplitude: 5.6 mV
Lead Channel Setting Pacing Amplitude: 2 V
Lead Channel Setting Pacing Amplitude: 2.5 V
Lead Channel Setting Pacing Pulse Width: 0.4 ms
Lead Channel Setting Sensing Sensitivity: 2.8 mV

## 2014-01-01 NOTE — Progress Notes (Signed)
Remote pacemaker transmission.   

## 2014-01-21 ENCOUNTER — Encounter: Payer: Self-pay | Admitting: Cardiology

## 2014-02-11 ENCOUNTER — Ambulatory Visit (INDEPENDENT_AMBULATORY_CARE_PROVIDER_SITE_OTHER): Payer: Medicare Other | Admitting: *Deleted

## 2014-02-11 DIAGNOSIS — I4891 Unspecified atrial fibrillation: Secondary | ICD-10-CM

## 2014-02-11 MED ORDER — APIXABAN 5 MG PO TABS: 5.0000 mg | ORAL_TABLET | Freq: Two times a day (BID) | ORAL | Status: DC

## 2014-02-11 NOTE — Patient Instructions (Signed)
6 month Eliquis follow up:   Pt was started on Eliquis for atrial fib on 06/27/13.   Reviewed patients medication list. Pt is not currently on any combined P-gp and strong CYP3A4 inhibitors/inducers (ketoconazole, traconazole, ritonavir, carbamazepine, phenytoin, rifampin, St. John's wort). Reviewed labs from 08/04/13. SCr 1.02, Weight 158, CrCl 53.09. Dose Is appropriate based on CrCl. Hgb and HCT 11.4/34.4.   A full discussion of the nature of anticoagulants has been carried out. A benefit/risk analysis has been presented to the patient, so that they understand the justification for choosing anticoagulation with Eliquis at this time. The need for compliance is stressed. Pt is aware to take the medication twice daily. Side effects of potential bleeding are discussed, including unusual colored urine or stools, coughing up blood or coffee ground emesis, nose bleeds or serious fall or head trauma. Discussed signs and symptoms of stroke. The patient should avoid any OTC items containing aspirin or ibuprofen. Avoid alcohol consumption. Call if any signs of abnormal bleeding. Discussed financial obligations and resolved any difficulty in obtaining medication.   Next lab test test in 6 months.

## 2014-03-26 ENCOUNTER — Encounter: Payer: Self-pay | Admitting: Internal Medicine

## 2014-04-07 ENCOUNTER — Encounter: Payer: Medicare Other | Admitting: *Deleted

## 2014-04-07 ENCOUNTER — Telehealth: Payer: Self-pay | Admitting: Cardiology

## 2014-04-07 NOTE — Telephone Encounter (Signed)
Spoke with pt and reminded pt of remote transmission that is due today. Pt verbalized understanding.   

## 2014-04-08 ENCOUNTER — Encounter: Payer: Self-pay | Admitting: Cardiology

## 2014-04-09 ENCOUNTER — Other Ambulatory Visit: Payer: Self-pay | Admitting: *Deleted

## 2014-04-09 MED ORDER — APIXABAN 5 MG PO TABS: 5.0000 mg | ORAL_TABLET | Freq: Two times a day (BID) | ORAL | Status: DC

## 2014-05-12 ENCOUNTER — Encounter: Payer: Self-pay | Admitting: *Deleted

## 2014-07-03 ENCOUNTER — Encounter (HOSPITAL_COMMUNITY): Payer: Self-pay | Admitting: Internal Medicine

## 2014-07-04 ENCOUNTER — Encounter: Payer: Self-pay | Admitting: Internal Medicine

## 2014-07-04 ENCOUNTER — Ambulatory Visit (INDEPENDENT_AMBULATORY_CARE_PROVIDER_SITE_OTHER): Payer: Medicare Other | Admitting: Internal Medicine

## 2014-07-04 VITALS — BP 158/90 | HR 73 | Ht 63.0 in | Wt 156.4 lb

## 2014-07-04 DIAGNOSIS — I442 Atrioventricular block, complete: Secondary | ICD-10-CM

## 2014-07-04 DIAGNOSIS — Z950 Presence of cardiac pacemaker: Secondary | ICD-10-CM

## 2014-07-04 DIAGNOSIS — I10 Essential (primary) hypertension: Secondary | ICD-10-CM

## 2014-07-04 DIAGNOSIS — I480 Paroxysmal atrial fibrillation: Secondary | ICD-10-CM

## 2014-07-04 LAB — MDC_IDC_ENUM_SESS_TYPE_INCLINIC
Battery Impedance: 109 Ohm
Battery Remaining Longevity: 151 mo
Battery Voltage: 2.78 V
Brady Statistic AP VP Percent: 2 %
Brady Statistic AP VS Percent: 38 %
Brady Statistic AS VP Percent: 2 %
Brady Statistic AS VS Percent: 58 %
Date Time Interrogation Session: 20151211091835
Lead Channel Impedance Value: 440 Ohm
Lead Channel Impedance Value: 619 Ohm
Lead Channel Pacing Threshold Amplitude: 0.5 V
Lead Channel Pacing Threshold Amplitude: 0.75 V
Lead Channel Pacing Threshold Pulse Width: 0.4 ms
Lead Channel Pacing Threshold Pulse Width: 0.4 ms
Lead Channel Sensing Intrinsic Amplitude: 1.4 mV
Lead Channel Sensing Intrinsic Amplitude: 5.6 mV
Lead Channel Setting Pacing Amplitude: 2 V
Lead Channel Setting Pacing Amplitude: 2.5 V
Lead Channel Setting Pacing Pulse Width: 0.4 ms
Lead Channel Setting Sensing Sensitivity: 2.8 mV

## 2014-07-04 NOTE — Patient Instructions (Signed)
Your physician recommends that you schedule a follow-up appointment in: 1 year with Dr. Rayann Heman. You will receive a reminder letter in the mail in about 10 months reminding you to call and schedule your appointment. If you don't receive this letter, please contact our office. [facility] device check 10/02/14. Your physician recommends that you continue on your current medications as directed. Please refer to the Current Medication list given to you today. Your physician recommends that you schedule a follow-up appointment in: 6 months with Edrick Oh in the anticoagulation clinic for the management of your eliquis. Please follow the 2 gram sodium diet information given to you today.

## 2014-07-04 NOTE — Progress Notes (Signed)
PCP:Manon Hilding, MD  The patient presents today for routine electrophysiology followup.  Since her pacemaker generator change, the patient reports doing well. She remains active and is without concerns today.  She exercises regularly at the [facility].  Today, she denies symptoms of palpitations, chest pain, shortness of breath, orthopnea, PND, lower extremity edema, dizziness, presyncope, syncope, or neurologic sequela.  She has had no bleeding with eliquis.  The patient feels that she is tolerating medications without difficulties and is otherwise without complaint today.   Past Medical History  Diagnosis Date  . Atrioventricular block, complete     s/p PPM implant originally in 2005 with generator change 2014 (MDT ADDR1 by Dr Rayann Heman)  . Hypertension   . Paroxysmal atrial fibrillation   . Esophageal reflux   . Pacemaker    Past Surgical History  Procedure Laterality Date  . Pacemaker insertion  2005    Medtronic Kappa [id number]  . Pacemaker generator change  01/18/2013    MDT ADDR1 implanted by Dr Rayann Heman  . Cataract extraction w/phaco Right 05/06/2013    Procedure: CATARACT EXTRACTION PHACO AND INTRAOCULAR LENS PLACEMENT (IOC) CDE=11.18;  Surgeon: Tonny Branch, MD;  Location: [facility];  Service: Ophthalmology;  Laterality: Right;  . Cataract extraction w/phaco Left 05/30/2013    Procedure: CATARACT EXTRACTION PHACO AND INTRAOCULAR LENS PLACEMENT (IOC);  Surgeon: Tonny Branch, MD;  Location: [facility];  Service: Ophthalmology;  Laterality: Left;  CDE:13.93  . Permanent pacemaker generator change Left 01/18/2013    Procedure: PERMANENT PACEMAKER GENERATOR CHANGE;  Surgeon: Thompson Grayer, MD;  Location: [facility] CATH LAB;  Service: Cardiovascular;  Laterality: Left;    Current Outpatient Prescriptions  Medication Sig Dispense Refill  . apixaban (ELIQUIS) 5 MG TABS tablet Take 1 tablet (5 mg total) by mouth 2 (two) times daily. 60 tablet 6  . Calcium Carbonate-Vit D-Min 600-400 MG-UNIT TABS Take 2 tablets by  mouth daily.      . fish oil-omega-3 fatty acids 1000 MG capsule Take 1 g by mouth 2 (two) times daily.     Marland Kitchen glucosamine-chondroitin 500-400 MG tablet Take 1 tablet by mouth daily.      . metoprolol tartrate (LOPRESSOR) 25 MG tablet Take 25 mg by mouth 2 (two) times daily.      . niacin (NIASPAN) 500 MG CR tablet Take 500 mg by mouth 4 (four) times daily.      No current facility-administered medications for this visit.    No Known Allergies  History   Social History  . Marital Status: Widowed    Spouse Name: N/A    Number of Children: N/A  . Years of Education: N/A   Occupational History  . RETIRED    Social History Main Topics  . Smoking status: Never Smoker   . Smokeless tobacco: Never Used  . Alcohol Use: No  . Drug Use: No  . Sexual Activity: Yes    Birth Control/ Protection: None   Other Topics Concern  . Not on file   Social History Narrative    Family History  Problem Relation Age of Onset  . Cancer Mother   . Stroke Father      Physical Exam: Filed Vitals:   07/04/14 0853  BP: 158/90  Pulse: 73  Height: 5\' 3"  (1.6 m)  Weight: 156 lb 6.4 oz (70.943 kg)  SpO2: 98%    GEN- The patient is well appearing, alert and oriented x 3 today.   Head- normocephalic, atraumatic Eyes-  Sclera clear, conjunctiva pink Ears-  hearing intact Oropharynx- clear Neck- supple, no JVP Lymph- no cervical lymphadenopathy Lungs- Clear to ausculation bilaterally, normal work of breathing Chest- pacemaker pocket is well healed Heart- Regular rate and rhythm, no murmurs, rubs or gallops, PMI not laterally displaced GI- soft, NT, ND, + BS Extremities- no clubbing, cyanosis, or edema Neuro- strength and sensation are intact  Pacemaker interrogation- reviewed in detail today,  See PACEART report  Assessment and Plan:  1. Complete heart block Normal pacemaker function See Pace Art report No changes today  2. Paroxysmal afib Stable (<1% burden) Continue eliquis Labs  per Dr Quintin Alto Return to see Lattie Haw in the anticoagulation clinic in 6 months  3. HTN Elevated today but she feels that her BP is typically controlled and does not wish to make changes 2 gram sodium diet, exercise and weight loss are encouraged  [facility] Return to see me in 1 year

## 2014-10-02 ENCOUNTER — Ambulatory Visit (INDEPENDENT_AMBULATORY_CARE_PROVIDER_SITE_OTHER): Payer: Commercial Managed Care - HMO | Admitting: *Deleted

## 2014-10-02 ENCOUNTER — Telehealth: Payer: Self-pay | Admitting: Cardiology

## 2014-10-02 DIAGNOSIS — I442 Atrioventricular block, complete: Secondary | ICD-10-CM | POA: Diagnosis not present

## 2014-10-02 LAB — MDC_IDC_ENUM_SESS_TYPE_REMOTE
Battery Impedance: 133 Ohm
Battery Remaining Longevity: 144 mo
Battery Voltage: 2.78 V
Brady Statistic AP VP Percent: 0 %
Brady Statistic AP VS Percent: 53 %
Brady Statistic AS VP Percent: 0 %
Brady Statistic AS VS Percent: 46 %
Date Time Interrogation Session: 20160310162308
Lead Channel Impedance Value: 493 Ohm
Lead Channel Impedance Value: 701 Ohm
Lead Channel Pacing Threshold Amplitude: 0.5 V
Lead Channel Pacing Threshold Amplitude: 0.75 V
Lead Channel Pacing Threshold Pulse Width: 0.4 ms
Lead Channel Pacing Threshold Pulse Width: 0.4 ms
Lead Channel Sensing Intrinsic Amplitude: 1 mV
Lead Channel Sensing Intrinsic Amplitude: 5.6 mV
Lead Channel Setting Pacing Amplitude: 2 V
Lead Channel Setting Pacing Amplitude: 2.5 V
Lead Channel Setting Pacing Pulse Width: 0.4 ms
Lead Channel Setting Sensing Sensitivity: 2.8 mV

## 2014-10-02 NOTE — Telephone Encounter (Signed)
Spoke with pt and reminded pt of remote transmission that is due today. Pt verbalized understanding.   

## 2014-10-02 NOTE — Progress Notes (Signed)
Remote pacemaker transmission.   

## 2014-10-06 ENCOUNTER — Encounter: Payer: Self-pay | Admitting: Cardiology

## 2014-10-09 ENCOUNTER — Encounter: Payer: Self-pay | Admitting: Internal Medicine

## 2014-11-24 ENCOUNTER — Other Ambulatory Visit: Payer: Self-pay | Admitting: *Deleted

## 2014-11-24 MED ORDER — APIXABAN 5 MG PO TABS: 5.0000 mg | ORAL_TABLET | Freq: Two times a day (BID) | ORAL | Status: DC

## 2015-01-01 ENCOUNTER — Ambulatory Visit (INDEPENDENT_AMBULATORY_CARE_PROVIDER_SITE_OTHER): Payer: Commercial Managed Care - HMO | Admitting: *Deleted

## 2015-01-01 ENCOUNTER — Encounter: Payer: Self-pay | Admitting: Internal Medicine

## 2015-01-01 DIAGNOSIS — I442 Atrioventricular block, complete: Secondary | ICD-10-CM

## 2015-01-01 NOTE — Progress Notes (Signed)
Remote pacemaker transmission.   

## 2015-01-04 LAB — CUP PACEART REMOTE DEVICE CHECK
Battery Impedance: 133 Ohm
Battery Remaining Longevity: 140 mo
Battery Voltage: 2.78 V
Brady Statistic AP VP Percent: 5 %
Brady Statistic AP VS Percent: 53 %
Brady Statistic AS VP Percent: 2 %
Brady Statistic AS VS Percent: 40 %
Date Time Interrogation Session: 20160609114321
Lead Channel Impedance Value: 446 Ohm
Lead Channel Impedance Value: 737 Ohm
Lead Channel Pacing Threshold Amplitude: 0.5 V
Lead Channel Pacing Threshold Amplitude: 0.5 V
Lead Channel Pacing Threshold Pulse Width: 0.4 ms
Lead Channel Pacing Threshold Pulse Width: 0.4 ms
Lead Channel Sensing Intrinsic Amplitude: 1 mV
Lead Channel Sensing Intrinsic Amplitude: 5.6 mV
Lead Channel Setting Pacing Amplitude: 2 V
Lead Channel Setting Pacing Amplitude: 2.5 V
Lead Channel Setting Pacing Pulse Width: 0.4 ms
Lead Channel Setting Sensing Sensitivity: 2.8 mV

## 2015-01-06 ENCOUNTER — Ambulatory Visit (INDEPENDENT_AMBULATORY_CARE_PROVIDER_SITE_OTHER): Payer: Medicare Other | Admitting: *Deleted

## 2015-01-06 DIAGNOSIS — I4891 Unspecified atrial fibrillation: Secondary | ICD-10-CM | POA: Diagnosis not present

## 2015-01-06 NOTE — Progress Notes (Signed)
Pt was started on Eliquis for atrial fib on 06/27/13.   Pt states she is tolerating Eliquis without any complications.  She has not had any S/S of excessive bruising, bleeding or GI upset.  Reviewed patients medication list. Pt is not currently on any combined P-gp and strong CYP3A4 inhibitors/inducers (ketoconazole, traconazole, ritonavir, carbamazepine, phenytoin, rifampin, St. John's wort). Reviewed labs from 01/06/15. SCr 1.02, Weight 157.4  CrCl 51.23. Dose is appropriate based on 2 out of 3 criteria (age, wt, SrCr).  Hgb and HCT 14.7/44.9   A full discussion of the nature of anticoagulants has been carried out. A benefit/risk analysis has been presented to the patient, so that they understand the justification for choosing anticoagulation with Eliquis at this time. The need for compliance is stressed. Pt is aware to take the medication twice daily. Side effects of potential bleeding are discussed, including unusual colored urine or stools, coughing up blood or coffee ground emesis, nose bleeds or serious fall or head trauma. Discussed signs and symptoms of stroke. The patient should avoid any OTC items containing aspirin or ibuprofen. Avoid alcohol consumption. Call if any signs of abnormal bleeding. Discussed financial obligations and resolved any difficulty in obtaining medication.  Next lab test test in 6 months.   Called pt with lab results/Put in recall for 6 month follow up

## 2015-01-13 ENCOUNTER — Encounter: Payer: Self-pay | Admitting: Cardiology

## 2015-04-08 ENCOUNTER — Ambulatory Visit (INDEPENDENT_AMBULATORY_CARE_PROVIDER_SITE_OTHER): Payer: Commercial Managed Care - HMO | Admitting: *Deleted

## 2015-04-08 DIAGNOSIS — I442 Atrioventricular block, complete: Secondary | ICD-10-CM | POA: Diagnosis not present

## 2015-04-09 NOTE — Progress Notes (Signed)
Remote pacemaker transmission.   

## 2015-04-23 LAB — CUP PACEART REMOTE DEVICE CHECK
Battery Impedance: 157 Ohm
Battery Remaining Longevity: 134 mo
Battery Voltage: 2.78 V
Brady Statistic AP VP Percent: 8 %
Brady Statistic AP VS Percent: 47 %
Brady Statistic AS VP Percent: 5 %
Brady Statistic AS VS Percent: 39 %
Date Time Interrogation Session: 20160914142603
Lead Channel Impedance Value: 440 Ohm
Lead Channel Impedance Value: 820 Ohm
Lead Channel Pacing Threshold Amplitude: 0.5 V
Lead Channel Pacing Threshold Amplitude: 0.75 V
Lead Channel Pacing Threshold Pulse Width: 0.4 ms
Lead Channel Pacing Threshold Pulse Width: 0.4 ms
Lead Channel Sensing Intrinsic Amplitude: 1 mV
Lead Channel Sensing Intrinsic Amplitude: 5.6 mV
Lead Channel Setting Pacing Amplitude: 2 V
Lead Channel Setting Pacing Amplitude: 2.5 V
Lead Channel Setting Pacing Pulse Width: 0.4 ms
Lead Channel Setting Sensing Sensitivity: 2.8 mV

## 2015-05-05 ENCOUNTER — Encounter: Payer: Self-pay | Admitting: Cardiology

## 2015-05-14 ENCOUNTER — Encounter: Payer: Self-pay | Admitting: Internal Medicine

## 2015-07-02 ENCOUNTER — Other Ambulatory Visit: Payer: Self-pay | Admitting: Internal Medicine

## 2015-07-31 ENCOUNTER — Ambulatory Visit (INDEPENDENT_AMBULATORY_CARE_PROVIDER_SITE_OTHER): Payer: Commercial Managed Care - HMO | Admitting: Internal Medicine

## 2015-07-31 ENCOUNTER — Encounter: Payer: Self-pay | Admitting: Internal Medicine

## 2015-07-31 VITALS — BP 158/98 | HR 88 | Ht 63.0 in | Wt 158.0 lb

## 2015-07-31 DIAGNOSIS — I442 Atrioventricular block, complete: Secondary | ICD-10-CM | POA: Diagnosis not present

## 2015-07-31 DIAGNOSIS — I10 Essential (primary) hypertension: Secondary | ICD-10-CM | POA: Diagnosis not present

## 2015-07-31 DIAGNOSIS — I480 Paroxysmal atrial fibrillation: Secondary | ICD-10-CM

## 2015-07-31 LAB — CUP PACEART INCLINIC DEVICE CHECK
Battery Impedance: 133 Ohm
Battery Remaining Longevity: 139 mo
Battery Voltage: 2.78 V
Brady Statistic AP VP Percent: 8 %
Brady Statistic AP VS Percent: 48 %
Brady Statistic AS VP Percent: 4 %
Brady Statistic AS VS Percent: 40 %
Date Time Interrogation Session: 20170106095038
Implantable Lead Implant Date: 20050805
Implantable Lead Implant Date: 20050805
Implantable Lead Location: 753859
Implantable Lead Location: 753860
Implantable Lead Model: 5076
Implantable Lead Model: 5076
Lead Channel Impedance Value: 417 Ohm
Lead Channel Impedance Value: 797 Ohm
Lead Channel Pacing Threshold Amplitude: 0.5 V
Lead Channel Pacing Threshold Amplitude: 0.5 V
Lead Channel Pacing Threshold Pulse Width: 0.4 ms
Lead Channel Pacing Threshold Pulse Width: 0.4 ms
Lead Channel Sensing Intrinsic Amplitude: 2 mV
Lead Channel Sensing Intrinsic Amplitude: 5.6 mV
Lead Channel Setting Pacing Amplitude: 2 V
Lead Channel Setting Pacing Amplitude: 2.5 V
Lead Channel Setting Pacing Pulse Width: 0.4 ms
Lead Channel Setting Sensing Sensitivity: 2.8 mV

## 2015-07-31 NOTE — Progress Notes (Signed)
PCP:Manon Hilding, MD  The patient presents today for routine electrophysiology followup.  Since her last visit, the patient reports doing well. She remains active and is without concerns today.  She exercises regularly at the [facility].  Today, she denies symptoms of palpitations, chest pain, shortness of breath, orthopnea, PND, lower extremity edema, dizziness, presyncope, syncope, or neurologic sequela.  She has had no bleeding with eliquis.  The patient feels that she is tolerating medications without difficulties and is otherwise without complaint today.   Past Medical History  Diagnosis Date  . Atrioventricular block, complete [facility])     s/p PPM implant originally in 2005 with generator change 2014 (MDT ADDR1 by Dr Rayann Heman)  . Hypertension   . Paroxysmal atrial fibrillation (HCC)   . Esophageal reflux   . Pacemaker    Past Surgical History  Procedure Laterality Date  . Pacemaker insertion  2005    Medtronic Kappa [id number]  . Pacemaker generator change  01/18/2013    MDT ADDR1 implanted by Dr Rayann Heman  . Cataract extraction w/phaco Right 05/06/2013    Procedure: CATARACT EXTRACTION PHACO AND INTRAOCULAR LENS PLACEMENT (IOC) CDE=11.18;  Surgeon: Tonny Branch, MD;  Location: [facility];  Service: Ophthalmology;  Laterality: Right;  . Cataract extraction w/phaco Left 05/30/2013    Procedure: CATARACT EXTRACTION PHACO AND INTRAOCULAR LENS PLACEMENT (IOC);  Surgeon: Tonny Branch, MD;  Location: [facility];  Service: Ophthalmology;  Laterality: Left;  CDE:13.93  . Permanent pacemaker generator change Left 01/18/2013    Procedure: PERMANENT PACEMAKER GENERATOR CHANGE;  Surgeon: Thompson Grayer, MD;  Location: [facility] CATH LAB;  Service: Cardiovascular;  Laterality: Left;    Current Outpatient Prescriptions  Medication Sig Dispense Refill  . Calcium Carbonate-Vit D-Min 600-400 MG-UNIT TABS Take 2 tablets by mouth daily.      Marland Kitchen ELIQUIS 5 MG TABS tablet TAKE 1 TABLET BY MOUTH 2 TIMES DAILY 60 tablet 6  . fish oil-omega-3  fatty acids 1000 MG capsule Take 1 g by mouth 2 (two) times daily.     Marland Kitchen glucosamine-chondroitin 500-400 MG tablet Take 1 tablet by mouth daily.      . metoprolol tartrate (LOPRESSOR) 25 MG tablet Take 25 mg by mouth 2 (two) times daily.      . niacin (NIASPAN) 500 MG CR tablet Take 500 mg by mouth 4 (four) times daily.      No current facility-administered medications for this visit.    No Known Allergies  Social History   Social History  . Marital Status: Widowed    Spouse Name: N/A  . Number of Children: N/A  . Years of Education: N/A   Occupational History  . RETIRED    Social History Main Topics  . Smoking status: Never Smoker   . Smokeless tobacco: Never Used  . Alcohol Use: No  . Drug Use: No  . Sexual Activity: Yes    Birth Control/ Protection: None   Other Topics Concern  . Not on file   Social History Narrative    Family History  Problem Relation Age of Onset  . Cancer Mother   . Stroke Father      Physical Exam: Filed Vitals:   07/31/15 0849  BP: 158/98  Pulse: 88  Height: 5\' 3"  (1.6 m)  Weight: 158 lb (71.668 kg)  SpO2: 98%    GEN- The patient is well appearing, alert and oriented x 3 today.   Head- normocephalic, atraumatic Eyes-  Sclera clear, conjunctiva pink Ears- hearing intact Oropharynx- clear Neck- supple,  no JVP Lymph- no cervical lymphadenopathy Lungs- Clear to ausculation bilaterally, normal work of breathing Chest- pacemaker pocket is well healed Heart- Regular rate and rhythm, no murmurs, rubs or gallops, PMI not laterally displaced GI- soft, NT, ND, + BS Extremities- no clubbing, cyanosis, or edema Neuro- strength and sensation are intact  Pacemaker interrogation- reviewed in detail today,  See PACEART report  Assessment and Plan:  1. Complete heart block Normal pacemaker function See Pace Art report No changes today  2. Paroxysmal afib Stable (2% burden) Continue eliquis Labs per Dr Quintin Alto  3. HTN Elevated  today but she feels that her BP is typically controlled and decilnes changes today 2 gram sodium diet, exercise and weight loss are encouraged  [facility] Return to see me in 1 year  Thompson Grayer MD, [facility] 07/31/2015 9:13 AM

## 2015-07-31 NOTE — Patient Instructions (Signed)
Your physician recommends that you continue on your current medications as directed. Please refer to the Current Medication list given to you today. Device check on 11/02/15. Your physician recommends that you schedule a follow-up appointment in: 1 year. You can schedule this appointment today or you can wait for your letter to come in the mail in about 10 months reminding you to call and schedule this appointment. If you do not receive this letter, please contact our office for your appointment. Your physician recommends that you follow a 2 gram sodium diet. Please refer to the information given to you today.

## 2015-11-02 ENCOUNTER — Ambulatory Visit (INDEPENDENT_AMBULATORY_CARE_PROVIDER_SITE_OTHER): Payer: Commercial Managed Care - HMO | Admitting: *Deleted

## 2015-11-02 ENCOUNTER — Telehealth: Payer: Self-pay | Admitting: Cardiology

## 2015-11-02 DIAGNOSIS — I442 Atrioventricular block, complete: Secondary | ICD-10-CM | POA: Diagnosis not present

## 2015-11-02 NOTE — Progress Notes (Signed)
Remote pacemaker transmission.   

## 2015-11-02 NOTE — Telephone Encounter (Signed)
Spoke with pt and reminded pt of remote transmission that is due today. Pt verbalized understanding.   

## 2015-11-24 LAB — CUP PACEART REMOTE DEVICE CHECK
Battery Impedance: 157 Ohm
Battery Remaining Longevity: 136 mo
Battery Voltage: 2.78 V
Brady Statistic AP VP Percent: 6 %
Brady Statistic AP VS Percent: 42 %
Brady Statistic AS VP Percent: 5 %
Brady Statistic AS VS Percent: 47 %
Date Time Interrogation Session: 20170410160316
Implantable Lead Implant Date: 20050805
Implantable Lead Implant Date: 20050805
Implantable Lead Location: 753859
Implantable Lead Location: 753860
Implantable Lead Model: 5076
Implantable Lead Model: 5076
Lead Channel Impedance Value: 446 Ohm
Lead Channel Impedance Value: 737 Ohm
Lead Channel Pacing Threshold Amplitude: 0.625 V
Lead Channel Pacing Threshold Amplitude: 0.625 V
Lead Channel Pacing Threshold Pulse Width: 0.4 ms
Lead Channel Pacing Threshold Pulse Width: 0.4 ms
Lead Channel Sensing Intrinsic Amplitude: 1 mV
Lead Channel Sensing Intrinsic Amplitude: 5.6 mV
Lead Channel Setting Pacing Amplitude: 2 V
Lead Channel Setting Pacing Amplitude: 2.5 V
Lead Channel Setting Pacing Pulse Width: 0.4 ms
Lead Channel Setting Sensing Sensitivity: 2 mV

## 2015-12-02 ENCOUNTER — Encounter: Payer: Self-pay | Admitting: Cardiology

## 2016-02-01 ENCOUNTER — Encounter: Payer: Commercial Managed Care - HMO | Admitting: *Deleted

## 2016-02-01 ENCOUNTER — Telehealth: Payer: Self-pay | Admitting: Cardiology

## 2016-02-01 NOTE — Telephone Encounter (Signed)
LMOVM reminding pt to send remote transmission.   

## 2016-02-05 ENCOUNTER — Encounter: Payer: Self-pay | Admitting: Cardiology

## 2016-02-15 ENCOUNTER — Other Ambulatory Visit: Payer: Self-pay | Admitting: *Deleted

## 2016-02-15 MED ORDER — APIXABAN 5 MG PO TABS: 5.0000 mg | ORAL_TABLET | Freq: Two times a day (BID) | ORAL | 6 refills | Status: DC

## 2016-02-17 ENCOUNTER — Ambulatory Visit (INDEPENDENT_AMBULATORY_CARE_PROVIDER_SITE_OTHER): Payer: Commercial Managed Care - HMO | Admitting: *Deleted

## 2016-02-17 DIAGNOSIS — I442 Atrioventricular block, complete: Secondary | ICD-10-CM

## 2016-02-18 NOTE — Progress Notes (Signed)
Remote pacemaker transmission.   

## 2016-02-19 ENCOUNTER — Encounter: Payer: Self-pay | Admitting: Cardiology

## 2016-02-22 LAB — CUP PACEART REMOTE DEVICE CHECK
Battery Impedance: 204 Ohm
Battery Remaining Longevity: 127 mo
Battery Voltage: 2.78 V
Brady Statistic AP VP Percent: 3 %
Brady Statistic AP VS Percent: 47 %
Brady Statistic AS VP Percent: 2 %
Brady Statistic AS VS Percent: 48 %
Date Time Interrogation Session: 20170726102136
Implantable Lead Implant Date: 20050805
Implantable Lead Implant Date: 20050805
Implantable Lead Location: 753859
Implantable Lead Location: 753860
Implantable Lead Model: 5076
Implantable Lead Model: 5076
Lead Channel Impedance Value: 446 Ohm
Lead Channel Impedance Value: 817 Ohm
Lead Channel Pacing Threshold Amplitude: 0.625 V
Lead Channel Pacing Threshold Amplitude: 0.75 V
Lead Channel Pacing Threshold Pulse Width: 0.4 ms
Lead Channel Pacing Threshold Pulse Width: 0.4 ms
Lead Channel Sensing Intrinsic Amplitude: 0.7 mV
Lead Channel Sensing Intrinsic Amplitude: 5.6 mV
Lead Channel Setting Pacing Amplitude: 2 V
Lead Channel Setting Pacing Amplitude: 2.5 V
Lead Channel Setting Pacing Pulse Width: 0.4 ms
Lead Channel Setting Sensing Sensitivity: 2.8 mV

## 2016-05-18 ENCOUNTER — Telehealth: Payer: Self-pay | Admitting: Cardiology

## 2016-05-18 ENCOUNTER — Ambulatory Visit (INDEPENDENT_AMBULATORY_CARE_PROVIDER_SITE_OTHER): Payer: Commercial Managed Care - HMO | Admitting: *Deleted

## 2016-05-18 DIAGNOSIS — I442 Atrioventricular block, complete: Secondary | ICD-10-CM

## 2016-05-18 NOTE — Progress Notes (Signed)
Remote pacemaker transmission.   

## 2016-05-18 NOTE — Telephone Encounter (Signed)
Spoke with pt and reminded pt of remote transmission that is due today. Pt verbalized understanding.   

## 2016-05-19 ENCOUNTER — Encounter: Payer: Self-pay | Admitting: Cardiology

## 2016-06-15 LAB — CUP PACEART REMOTE DEVICE CHECK
Battery Impedance: 180 Ohm
Battery Remaining Longevity: 131 mo
Battery Voltage: 2.78 V
Brady Statistic AP VP Percent: 2 %
Brady Statistic AP VS Percent: 50 %
Brady Statistic AS VP Percent: 2 %
Brady Statistic AS VS Percent: 46 %
Date Time Interrogation Session: 20171025162604
Implantable Lead Implant Date: 20050805
Implantable Lead Implant Date: 20050805
Implantable Lead Location: 753859
Implantable Lead Location: 753860
Implantable Lead Model: 5076
Implantable Lead Model: 5076
Implantable Pulse Generator Implant Date: 20140627
Lead Channel Impedance Value: 440 Ohm
Lead Channel Impedance Value: 795 Ohm
Lead Channel Pacing Threshold Amplitude: 0.625 V
Lead Channel Pacing Threshold Amplitude: 0.75 V
Lead Channel Pacing Threshold Pulse Width: 0.4 ms
Lead Channel Pacing Threshold Pulse Width: 0.4 ms
Lead Channel Sensing Intrinsic Amplitude: 1.4 mV
Lead Channel Sensing Intrinsic Amplitude: 5.6 mV
Lead Channel Setting Pacing Amplitude: 2 V
Lead Channel Setting Pacing Amplitude: 2.5 V
Lead Channel Setting Pacing Pulse Width: 0.4 ms
Lead Channel Setting Sensing Sensitivity: 2 mV

## 2016-07-29 ENCOUNTER — Ambulatory Visit (INDEPENDENT_AMBULATORY_CARE_PROVIDER_SITE_OTHER): Payer: Medicare HMO | Admitting: Internal Medicine

## 2016-07-29 ENCOUNTER — Encounter: Payer: Self-pay | Admitting: Internal Medicine

## 2016-07-29 VITALS — BP 140/80 | HR 81 | Ht 63.0 in | Wt 165.0 lb

## 2016-07-29 DIAGNOSIS — I480 Paroxysmal atrial fibrillation: Secondary | ICD-10-CM | POA: Diagnosis not present

## 2016-07-29 DIAGNOSIS — I10 Essential (primary) hypertension: Secondary | ICD-10-CM

## 2016-07-29 DIAGNOSIS — I442 Atrioventricular block, complete: Secondary | ICD-10-CM

## 2016-07-29 LAB — CUP PACEART INCLINIC DEVICE CHECK
Battery Impedance: 180 Ohm
Battery Remaining Longevity: 131 mo
Battery Voltage: 2.78 V
Brady Statistic AP VP Percent: 2 %
Brady Statistic AP VS Percent: 51 %
Brady Statistic AS VP Percent: 1 %
Brady Statistic AS VS Percent: 46 %
Date Time Interrogation Session: 20180105101223
Implantable Lead Implant Date: 20050805
Implantable Lead Implant Date: 20050805
Implantable Lead Location: 753859
Implantable Lead Location: 753860
Implantable Lead Model: 5076
Implantable Lead Model: 5076
Implantable Pulse Generator Implant Date: 20140627
Lead Channel Impedance Value: 453 Ohm
Lead Channel Impedance Value: 687 Ohm
Lead Channel Pacing Threshold Amplitude: 0.75 V
Lead Channel Pacing Threshold Amplitude: 0.75 V
Lead Channel Pacing Threshold Amplitude: 0.75 V
Lead Channel Pacing Threshold Amplitude: 0.75 V
Lead Channel Pacing Threshold Pulse Width: 0.4 ms
Lead Channel Pacing Threshold Pulse Width: 0.4 ms
Lead Channel Pacing Threshold Pulse Width: 0.4 ms
Lead Channel Pacing Threshold Pulse Width: 0.4 ms
Lead Channel Sensing Intrinsic Amplitude: 2 mV
Lead Channel Sensing Intrinsic Amplitude: 4 mV
Lead Channel Setting Pacing Amplitude: 2 V
Lead Channel Setting Pacing Amplitude: 2.5 V
Lead Channel Setting Pacing Pulse Width: 0.4 ms
Lead Channel Setting Sensing Sensitivity: 2 mV

## 2016-07-29 NOTE — Progress Notes (Signed)
PCP:Manon Hilding, MD  The patient presents today for routine electrophysiology followup.  Since her last visit, the patient reports doing well. She remains active and is without concerns today.  She continues to exercise regularly at the [facility].  Today, she denies symptoms of palpitations, chest pain, shortness of breath, orthopnea, PND, lower extremity edema, dizziness, presyncope, syncope, or neurologic sequela.  She has had no bleeding with eliquis.  The patient feels that she is tolerating medications without difficulties and is otherwise without complaint today.   Past Medical History:  Diagnosis Date  . Atrioventricular block, complete [facility])    s/p PPM implant originally in 2005 with generator change 2014 (MDT ADDR1 by Dr Rayann Heman)  . Esophageal reflux   . Hypertension   . Pacemaker   . Paroxysmal atrial fibrillation [facility])    Past Surgical History:  Procedure Laterality Date  . CATARACT EXTRACTION W/PHACO Right 05/06/2013   Procedure: CATARACT EXTRACTION PHACO AND INTRAOCULAR LENS PLACEMENT (IOC) CDE=11.18;  Surgeon: Tonny Branch, MD;  Location: [facility];  Service: Ophthalmology;  Laterality: Right;  . CATARACT EXTRACTION W/PHACO Left 05/30/2013   Procedure: CATARACT EXTRACTION PHACO AND INTRAOCULAR LENS PLACEMENT (IOC);  Surgeon: Tonny Branch, MD;  Location: [facility];  Service: Ophthalmology;  Laterality: Left;  CDE:13.93  . PACEMAKER GENERATOR CHANGE  01/18/2013   MDT ADDR1 implanted by Dr Rayann Heman  . PACEMAKER INSERTION  2005   Medtronic Kappa [id number]  . PERMANENT PACEMAKER GENERATOR CHANGE Left 01/18/2013   Procedure: PERMANENT PACEMAKER GENERATOR CHANGE;  Surgeon: Thompson Grayer, MD;  Location: [facility] CATH LAB;  Service: Cardiovascular;  Laterality: Left;    Current Outpatient Prescriptions  Medication Sig Dispense Refill  . apixaban (ELIQUIS) 5 MG TABS tablet Take 1 tablet (5 mg total) by mouth 2 (two) times daily. 60 tablet 6  . Calcium Carbonate-Vit D-Min 600-400 MG-UNIT TABS Take 2 tablets  by mouth daily.      . fish oil-omega-3 fatty acids 1000 MG capsule Take 1 g by mouth 2 (two) times daily.     Marland Kitchen glucosamine-chondroitin 500-400 MG tablet Take 1 tablet by mouth daily.      . metoprolol tartrate (LOPRESSOR) 25 MG tablet Take 25 mg by mouth 2 (two) times daily.      . niacin (NIASPAN) 500 MG CR tablet Take 500 mg by mouth 4 (four) times daily.     . pravastatin (PRAVACHOL) 10 MG tablet Take 10 mg by mouth at bedtime.  0   No current facility-administered medications for this visit.     No Known Allergies  Social History   Social History  . Marital status: Widowed    Spouse name: N/A  . Number of children: N/A  . Years of education: N/A   Occupational History  . RETIRED Retired   Social History Main Topics  . Smoking status: Never Smoker  . Smokeless tobacco: Never Used  . Alcohol use No  . Drug use: No  . Sexual activity: Yes    Birth control/ protection: None   Other Topics Concern  . Not on file   Social History Narrative  . No narrative on file    Family History  Problem Relation Age of Onset  . Cancer Mother   . Stroke Father      Physical Exam: Vitals:   07/29/16 0924  BP: 140/80  Pulse: 81  SpO2: 98%  Weight: 165 lb (74.8 kg)  Height: 5\' 3"  (1.6 m)    GEN- The patient is well appearing, alert  and oriented x 3 today.   Head- normocephalic, atraumatic Eyes-  Sclera clear, conjunctiva pink Ears- hearing intact Oropharynx- clear Neck- supple,   Lungs- Clear to ausculation bilaterally, normal work of breathing Chest- pacemaker pocket is well healed Heart- Regular rate and rhythm, no murmurs, rubs or gallops, PMI not laterally displaced GI- soft, NT, ND, + BS Extremities- no clubbing, cyanosis, or edema Neuro- strength and sensation are intact  Pacemaker interrogation- reviewed in detail today,  See PACEART report  Assessment and Plan:  1. Complete heart block Normal pacemaker function See Pace Art report No changes today  2.  Paroxysmal afib Stable (2% burden is unchanged from last year) Continue eliquis Labs per Dr Quintin Alto  3. HTN Stable No change required today  Carelink Return to see me in 1 year  Thompson Grayer MD, [facility] 07/29/2016 9:39 AM

## 2016-07-29 NOTE — Patient Instructions (Addendum)
Medication Instructions:  Your physician recommends that you continue on your current medications as directed. Please refer to the Current Medication list given to you today.  Labwork: NONE  Testing/Procedures: NONE  Follow-Up: Your physician recommends that you schedule a follow-up appointment in: 1 year. Please schedule this appointment today before you leave.  Any Other Special Instructions Will Be Listed Below (If Applicable). Your next device check is 10/31/16.  If you need a refill on your cardiac medications before your next appointment, please call your pharmacy.

## 2016-08-23 DIAGNOSIS — R69 Illness, unspecified: Secondary | ICD-10-CM | POA: Diagnosis not present

## 2016-08-29 DIAGNOSIS — J209 Acute bronchitis, unspecified: Secondary | ICD-10-CM | POA: Diagnosis not present

## 2016-08-29 DIAGNOSIS — Z6828 Body mass index (BMI) 28.0-28.9, adult: Secondary | ICD-10-CM | POA: Diagnosis not present

## 2016-08-29 DIAGNOSIS — R05 Cough: Secondary | ICD-10-CM | POA: Diagnosis not present

## 2016-08-30 DIAGNOSIS — E782 Mixed hyperlipidemia: Secondary | ICD-10-CM | POA: Diagnosis not present

## 2016-08-30 DIAGNOSIS — N183 Chronic kidney disease, stage 3 (moderate): Secondary | ICD-10-CM | POA: Diagnosis not present

## 2016-08-30 DIAGNOSIS — E7800 Pure hypercholesterolemia, unspecified: Secondary | ICD-10-CM | POA: Diagnosis not present

## 2016-08-30 DIAGNOSIS — I10 Essential (primary) hypertension: Secondary | ICD-10-CM | POA: Diagnosis not present

## 2016-08-30 DIAGNOSIS — I482 Chronic atrial fibrillation: Secondary | ICD-10-CM | POA: Diagnosis not present

## 2016-09-02 DIAGNOSIS — I482 Chronic atrial fibrillation: Secondary | ICD-10-CM | POA: Diagnosis not present

## 2016-09-02 DIAGNOSIS — E782 Mixed hyperlipidemia: Secondary | ICD-10-CM | POA: Diagnosis not present

## 2016-09-02 DIAGNOSIS — Z6828 Body mass index (BMI) 28.0-28.9, adult: Secondary | ICD-10-CM | POA: Diagnosis not present

## 2016-09-02 DIAGNOSIS — I10 Essential (primary) hypertension: Secondary | ICD-10-CM | POA: Diagnosis not present

## 2016-09-02 DIAGNOSIS — N183 Chronic kidney disease, stage 3 (moderate): Secondary | ICD-10-CM | POA: Diagnosis not present

## 2016-09-02 DIAGNOSIS — R7301 Impaired fasting glucose: Secondary | ICD-10-CM | POA: Diagnosis not present

## 2016-09-05 DIAGNOSIS — R69 Illness, unspecified: Secondary | ICD-10-CM | POA: Diagnosis not present

## 2016-10-31 ENCOUNTER — Encounter: Payer: Medicare HMO | Admitting: *Deleted

## 2016-10-31 ENCOUNTER — Telehealth: Payer: Self-pay | Admitting: Cardiology

## 2016-10-31 NOTE — Telephone Encounter (Signed)
Spoke with pt and reminded pt of remote transmission that is due today. Pt verbalized understanding.   

## 2016-11-04 ENCOUNTER — Encounter: Payer: Self-pay | Admitting: Cardiology

## 2016-11-09 ENCOUNTER — Ambulatory Visit (INDEPENDENT_AMBULATORY_CARE_PROVIDER_SITE_OTHER): Payer: Medicare HMO | Admitting: *Deleted

## 2016-11-09 DIAGNOSIS — I442 Atrioventricular block, complete: Secondary | ICD-10-CM | POA: Diagnosis not present

## 2016-11-15 NOTE — Progress Notes (Signed)
Remote pacemaker transmission.   

## 2016-11-17 ENCOUNTER — Encounter: Payer: Self-pay | Admitting: Cardiology

## 2016-11-17 LAB — CUP PACEART REMOTE DEVICE CHECK
Battery Impedance: 204 Ohm
Battery Remaining Longevity: 127 mo
Battery Voltage: 2.78 V
Brady Statistic AP VP Percent: 0 %
Brady Statistic AP VS Percent: 58 %
Brady Statistic AS VP Percent: 0 %
Brady Statistic AS VS Percent: 42 %
Date Time Interrogation Session: 20180418182905
Implantable Lead Implant Date: 20050805
Implantable Lead Implant Date: 20050805
Implantable Lead Location: 753859
Implantable Lead Location: 753860
Implantable Lead Model: 5076
Implantable Lead Model: 5076
Implantable Pulse Generator Implant Date: 20140627
Lead Channel Impedance Value: 459 Ohm
Lead Channel Impedance Value: 815 Ohm
Lead Channel Pacing Threshold Amplitude: 0.75 V
Lead Channel Pacing Threshold Amplitude: 0.75 V
Lead Channel Pacing Threshold Pulse Width: 0.4 ms
Lead Channel Pacing Threshold Pulse Width: 0.4 ms
Lead Channel Sensing Intrinsic Amplitude: 1 mV
Lead Channel Sensing Intrinsic Amplitude: 5.6 mV
Lead Channel Setting Pacing Amplitude: 2 V
Lead Channel Setting Pacing Amplitude: 2.5 V
Lead Channel Setting Pacing Pulse Width: 0.4 ms
Lead Channel Setting Sensing Sensitivity: 2 mV

## 2016-11-18 ENCOUNTER — Other Ambulatory Visit: Payer: Self-pay | Admitting: Internal Medicine

## 2016-11-18 MED ORDER — APIXABAN 5 MG PO TABS: 5.0000 mg | ORAL_TABLET | Freq: Two times a day (BID) | ORAL | 6 refills | Status: DC

## 2016-11-18 NOTE — Telephone Encounter (Signed)
° ° ° °  1. Which medications need to be refilled? (please list name of each medication and dose if known)  apixaban (ELIQUIS) 5 MG TABS tablet    2. Which pharmacy/location (including street and city if local pharmacy) is medication to be sent to?    [facility]   [place], [place]    3. Do they need a 30 day or 90 day supply?

## 2016-12-13 DIAGNOSIS — Z972 Presence of dental prosthetic device (complete) (partial): Secondary | ICD-10-CM | POA: Diagnosis not present

## 2016-12-13 DIAGNOSIS — K08409 Partial loss of teeth, unspecified cause, unspecified class: Secondary | ICD-10-CM | POA: Diagnosis not present

## 2016-12-13 DIAGNOSIS — Z950 Presence of cardiac pacemaker: Secondary | ICD-10-CM | POA: Diagnosis not present

## 2016-12-13 DIAGNOSIS — I480 Paroxysmal atrial fibrillation: Secondary | ICD-10-CM | POA: Diagnosis not present

## 2016-12-13 DIAGNOSIS — Z0000 Encounter for general adult medical examination without abnormal findings: Secondary | ICD-10-CM | POA: Diagnosis not present

## 2016-12-13 DIAGNOSIS — I10 Essential (primary) hypertension: Secondary | ICD-10-CM | POA: Diagnosis not present

## 2016-12-13 DIAGNOSIS — Z79899 Other long term (current) drug therapy: Secondary | ICD-10-CM | POA: Diagnosis not present

## 2016-12-13 DIAGNOSIS — Z6828 Body mass index (BMI) 28.0-28.9, adult: Secondary | ICD-10-CM | POA: Diagnosis not present

## 2016-12-13 DIAGNOSIS — Z7901 Long term (current) use of anticoagulants: Secondary | ICD-10-CM | POA: Diagnosis not present

## 2016-12-13 DIAGNOSIS — E7800 Pure hypercholesterolemia, unspecified: Secondary | ICD-10-CM | POA: Diagnosis not present

## 2016-12-21 DIAGNOSIS — Z0100 Encounter for examination of eyes and vision without abnormal findings: Secondary | ICD-10-CM | POA: Diagnosis not present

## 2016-12-21 DIAGNOSIS — H26491 Other secondary cataract, right eye: Secondary | ICD-10-CM | POA: Diagnosis not present

## 2016-12-21 DIAGNOSIS — I10 Essential (primary) hypertension: Secondary | ICD-10-CM | POA: Diagnosis not present

## 2017-02-27 DIAGNOSIS — N183 Chronic kidney disease, stage 3 (moderate): Secondary | ICD-10-CM | POA: Diagnosis not present

## 2017-02-27 DIAGNOSIS — E7800 Pure hypercholesterolemia, unspecified: Secondary | ICD-10-CM | POA: Diagnosis not present

## 2017-02-27 DIAGNOSIS — I482 Chronic atrial fibrillation: Secondary | ICD-10-CM | POA: Diagnosis not present

## 2017-02-27 DIAGNOSIS — E782 Mixed hyperlipidemia: Secondary | ICD-10-CM | POA: Diagnosis not present

## 2017-02-27 DIAGNOSIS — I10 Essential (primary) hypertension: Secondary | ICD-10-CM | POA: Diagnosis not present

## 2017-02-27 DIAGNOSIS — Z0001 Encounter for general adult medical examination with abnormal findings: Secondary | ICD-10-CM | POA: Diagnosis not present

## 2017-02-27 DIAGNOSIS — R7301 Impaired fasting glucose: Secondary | ICD-10-CM | POA: Diagnosis not present

## 2017-02-27 DIAGNOSIS — Z6828 Body mass index (BMI) 28.0-28.9, adult: Secondary | ICD-10-CM | POA: Diagnosis not present

## 2017-02-28 ENCOUNTER — Ambulatory Visit (INDEPENDENT_AMBULATORY_CARE_PROVIDER_SITE_OTHER): Payer: Medicare HMO | Admitting: *Deleted

## 2017-02-28 DIAGNOSIS — I442 Atrioventricular block, complete: Secondary | ICD-10-CM

## 2017-02-28 NOTE — Progress Notes (Signed)
Remote pacemaker transmission.   

## 2017-03-01 ENCOUNTER — Encounter: Payer: Self-pay | Admitting: Cardiology

## 2017-03-02 DIAGNOSIS — Z6828 Body mass index (BMI) 28.0-28.9, adult: Secondary | ICD-10-CM | POA: Diagnosis not present

## 2017-03-02 DIAGNOSIS — E782 Mixed hyperlipidemia: Secondary | ICD-10-CM | POA: Diagnosis not present

## 2017-03-02 DIAGNOSIS — I10 Essential (primary) hypertension: Secondary | ICD-10-CM | POA: Diagnosis not present

## 2017-03-02 DIAGNOSIS — N183 Chronic kidney disease, stage 3 (moderate): Secondary | ICD-10-CM | POA: Diagnosis not present

## 2017-03-02 DIAGNOSIS — I482 Chronic atrial fibrillation: Secondary | ICD-10-CM | POA: Diagnosis not present

## 2017-03-02 DIAGNOSIS — R7301 Impaired fasting glucose: Secondary | ICD-10-CM | POA: Diagnosis not present

## 2017-03-08 DIAGNOSIS — R69 Illness, unspecified: Secondary | ICD-10-CM | POA: Diagnosis not present

## 2017-03-13 ENCOUNTER — Other Ambulatory Visit: Payer: Self-pay

## 2017-03-13 MED ORDER — APIXABAN 5 MG PO TABS: 5.0000 mg | ORAL_TABLET | Freq: Two times a day (BID) | ORAL | 0 refills | Status: DC

## 2017-03-17 LAB — CUP PACEART REMOTE DEVICE CHECK
Battery Impedance: 228 Ohm
Battery Remaining Longevity: 122 mo
Battery Voltage: 2.78 V
Brady Statistic AP VP Percent: 0 %
Brady Statistic AP VS Percent: 60 %
Brady Statistic AS VP Percent: 0 %
Brady Statistic AS VS Percent: 40 %
Date Time Interrogation Session: 20180807121550
Implantable Lead Implant Date: 20050805
Implantable Lead Implant Date: 20050805
Implantable Lead Location: 753859
Implantable Lead Location: 753860
Implantable Lead Model: 5076
Implantable Lead Model: 5076
Implantable Pulse Generator Implant Date: 20140627
Lead Channel Impedance Value: 465 Ohm
Lead Channel Impedance Value: 772 Ohm
Lead Channel Pacing Threshold Amplitude: 0.625 V
Lead Channel Pacing Threshold Amplitude: 0.75 V
Lead Channel Pacing Threshold Pulse Width: 0.4 ms
Lead Channel Pacing Threshold Pulse Width: 0.4 ms
Lead Channel Setting Pacing Amplitude: 2 V
Lead Channel Setting Pacing Amplitude: 2.5 V
Lead Channel Setting Pacing Pulse Width: 0.4 ms
Lead Channel Setting Sensing Sensitivity: 2.8 mV

## 2017-05-02 ENCOUNTER — Other Ambulatory Visit: Payer: Self-pay | Admitting: *Deleted

## 2017-05-02 MED ORDER — APIXABAN 5 MG PO TABS: 5.0000 mg | ORAL_TABLET | Freq: Two times a day (BID) | ORAL | 3 refills | Status: DC

## 2017-05-05 DIAGNOSIS — R69 Illness, unspecified: Secondary | ICD-10-CM | POA: Diagnosis not present

## 2017-05-12 ENCOUNTER — Telehealth: Payer: Self-pay

## 2017-05-12 NOTE — Telephone Encounter (Signed)
**Note De-Identified [place] Obfuscation** Denial received Zanovia Rotz fax from [place] pt assistance program for Eliquis. Reason: Product covered by [employer].

## 2017-05-30 ENCOUNTER — Telehealth: Payer: Self-pay | Admitting: Cardiology

## 2017-05-30 ENCOUNTER — Ambulatory Visit (INDEPENDENT_AMBULATORY_CARE_PROVIDER_SITE_OTHER): Payer: Medicare HMO | Admitting: *Deleted

## 2017-05-30 DIAGNOSIS — I442 Atrioventricular block, complete: Secondary | ICD-10-CM | POA: Diagnosis not present

## 2017-05-30 NOTE — Telephone Encounter (Signed)
Spoke with pt and reminded pt of remote transmission that is due today. Pt verbalized understanding.   

## 2017-05-31 NOTE — Progress Notes (Signed)
Remote pacemaker transmission.   

## 2017-06-01 ENCOUNTER — Encounter: Payer: Self-pay | Admitting: Cardiology

## 2017-06-03 LAB — CUP PACEART REMOTE DEVICE CHECK
Battery Impedance: 252 Ohm
Battery Remaining Longevity: 119 mo
Battery Voltage: 2.78 V
Brady Statistic AP VP Percent: 0 %
Brady Statistic AP VS Percent: 60 %
Brady Statistic AS VP Percent: 0 %
Brady Statistic AS VS Percent: 39 %
Date Time Interrogation Session: 20181106190908
Implantable Lead Implant Date: 20050805
Implantable Lead Implant Date: 20050805
Implantable Lead Location: 753859
Implantable Lead Location: 753860
Implantable Lead Model: 5076
Implantable Lead Model: 5076
Implantable Pulse Generator Implant Date: 20140627
Lead Channel Impedance Value: 453 Ohm
Lead Channel Impedance Value: 714 Ohm
Lead Channel Pacing Threshold Amplitude: 0.625 V
Lead Channel Pacing Threshold Amplitude: 0.75 V
Lead Channel Pacing Threshold Pulse Width: 0.4 ms
Lead Channel Pacing Threshold Pulse Width: 0.4 ms
Lead Channel Setting Pacing Amplitude: 2 V
Lead Channel Setting Pacing Amplitude: 2.5 V
Lead Channel Setting Pacing Pulse Width: 0.4 ms
Lead Channel Setting Sensing Sensitivity: 2 mV

## 2017-07-28 ENCOUNTER — Encounter: Payer: Self-pay | Admitting: Internal Medicine

## 2017-07-28 ENCOUNTER — Ambulatory Visit: Payer: Medicare HMO | Admitting: Internal Medicine

## 2017-07-28 VITALS — BP 126/80 | HR 83 | Ht 63.0 in | Wt 162.6 lb

## 2017-07-28 DIAGNOSIS — I442 Atrioventricular block, complete: Secondary | ICD-10-CM | POA: Diagnosis not present

## 2017-07-28 DIAGNOSIS — I4891 Unspecified atrial fibrillation: Secondary | ICD-10-CM

## 2017-07-28 DIAGNOSIS — I10 Essential (primary) hypertension: Secondary | ICD-10-CM | POA: Diagnosis not present

## 2017-07-28 LAB — CUP PACEART INCLINIC DEVICE CHECK
Battery Impedance: 252 Ohm
Battery Remaining Longevity: 118 mo
Battery Voltage: 2.78 V
Brady Statistic AP VP Percent: 0 %
Brady Statistic AP VS Percent: 61 %
Brady Statistic AS VP Percent: 0 %
Brady Statistic AS VS Percent: 39 %
Date Time Interrogation Session: 20190104141204
Implantable Lead Implant Date: 20050805
Implantable Lead Implant Date: 20050805
Implantable Lead Location: 753859
Implantable Lead Location: 753860
Implantable Lead Model: 5076
Implantable Lead Model: 5076
Implantable Pulse Generator Implant Date: 20140627
Lead Channel Impedance Value: 465 Ohm
Lead Channel Impedance Value: 753 Ohm
Lead Channel Pacing Threshold Amplitude: 0.75 V
Lead Channel Pacing Threshold Amplitude: 0.75 V
Lead Channel Pacing Threshold Amplitude: 0.75 V
Lead Channel Pacing Threshold Amplitude: 0.875 V
Lead Channel Pacing Threshold Pulse Width: 0.4 ms
Lead Channel Pacing Threshold Pulse Width: 0.4 ms
Lead Channel Pacing Threshold Pulse Width: 0.4 ms
Lead Channel Pacing Threshold Pulse Width: 0.4 ms
Lead Channel Sensing Intrinsic Amplitude: 2 mV
Lead Channel Sensing Intrinsic Amplitude: 5.6 mV
Lead Channel Setting Pacing Amplitude: 2 V
Lead Channel Setting Pacing Amplitude: 2.5 V
Lead Channel Setting Pacing Pulse Width: 0.4 ms
Lead Channel Setting Sensing Sensitivity: 2.8 mV

## 2017-07-28 MED ORDER — APIXABAN 5 MG PO TABS: 5.0000 mg | ORAL_TABLET | Freq: Two times a day (BID) | ORAL | 0 refills | Status: DC

## 2017-07-28 NOTE — Addendum Note (Signed)
Addended by: Laurine Blazer on: 07/28/2017 09:15 AM   Modules accepted: Orders

## 2017-07-28 NOTE — Patient Instructions (Signed)
Medication Instructions:  Continue all current medications.  Labwork: none  Testing/Procedures: none  Follow-Up: Your physician wants you to follow up in:  1 year.  You will receive a reminder letter in the mail one-two months in advance.  If you don't receive a letter, please call our office to schedule the follow up appointment.  Any Other Special Instructions Will Be Listed Below (If Applicable). Remote monitoring is used to monitor your Pacemaker of ICD from home. This monitoring reduces the number of office visits required to check your device to one time per year. It allows [place] to keep an eye on the functioning of your device to ensure it is working properly. You are scheduled for a device check from home on 08/29/2017. You may send your transmission at any time that day. If you have a wireless device, the transmission will be sent automatically. After your physician reviews your transmission, you will receive a postcard with your next transmission date.  If you need a refill on your cardiac medications before your next appointment, please call your pharmacy.

## 2017-07-28 NOTE — Progress Notes (Signed)
PCP: Manon Hilding, MD   Primary EP:  Dr Rayann Heman  Cassandra Young is a 83 y.o. female who presents today for routine electrophysiology followup.  Since last being seen in our clinic, the patient reports doing very well.  Today, she denies symptoms of palpitations, chest pain, shortness of breath,  lower extremity edema, dizziness, presyncope, or syncope.  The patient is otherwise without complaint today.   Past Medical History:  Diagnosis Date  . Atrioventricular block, complete [facility])    s/p PPM implant originally in 2005 with generator change 2014 (MDT ADDR1 by Dr Rayann Heman)  . Esophageal reflux   . Hypertension   . Pacemaker   . Paroxysmal atrial fibrillation [facility])    Past Surgical History:  Procedure Laterality Date  . CATARACT EXTRACTION W/PHACO Right 05/06/2013   Procedure: CATARACT EXTRACTION PHACO AND INTRAOCULAR LENS PLACEMENT (IOC) CDE=11.18;  Surgeon: Tonny Branch, MD;  Location: [facility];  Service: Ophthalmology;  Laterality: Right;  . CATARACT EXTRACTION W/PHACO Left 05/30/2013   Procedure: CATARACT EXTRACTION PHACO AND INTRAOCULAR LENS PLACEMENT (IOC);  Surgeon: Tonny Branch, MD;  Location: [facility];  Service: Ophthalmology;  Laterality: Left;  CDE:13.93  . PACEMAKER GENERATOR CHANGE  01/18/2013   MDT ADDR1 implanted by Dr Rayann Heman  . PACEMAKER INSERTION  2005   Medtronic Kappa [id number]  . PERMANENT PACEMAKER GENERATOR CHANGE Left 01/18/2013   Procedure: PERMANENT PACEMAKER GENERATOR CHANGE;  Surgeon: Thompson Grayer, MD;  Location: [facility] CATH LAB;  Service: Cardiovascular;  Laterality: Left;    ROS- all systems are reviewed and negative except as per HPI above  Current Outpatient Medications  Medication Sig Dispense Refill  . apixaban (ELIQUIS) 5 MG TABS tablet Take 1 tablet (5 mg total) by mouth 2 (two) times daily. 180 tablet 3  . Calcium Carbonate-Vit D-Min 600-400 MG-UNIT TABS Take 2 tablets by mouth daily.      . fish oil-omega-3 fatty acids 1000 MG capsule Take 1 g by mouth 2  (two) times daily.     Marland Kitchen glucosamine-chondroitin 500-400 MG tablet Take 1 tablet by mouth daily.      . metoprolol tartrate (LOPRESSOR) 25 MG tablet Take 25 mg by mouth 2 (two) times daily.      . pravastatin (PRAVACHOL) 10 MG tablet Take 10 mg by mouth at bedtime.  0   No current facility-administered medications for this visit.     Physical Exam: Vitals:   07/28/17 0844  BP: 126/80  Pulse: 83  SpO2: 99%  Weight: 162 lb 9.6 oz (73.8 kg)  Height: 5\' 3"  (1.6 m)    GEN- The patient is well appearing, alert and oriented x 3 today.   Head- normocephalic, atraumatic Eyes-  Sclera clear, conjunctiva pink Ears- hearing intact Oropharynx- clear Lungs- Clear to ausculation bilaterally, normal work of breathing Chest- pacemaker pocket is well healed Heart- Regular rate and rhythm, no murmurs, rubs or gallops, PMI not laterally displaced GI- soft, NT, ND, + BS Extremities- no clubbing, cyanosis, or edema  Pacemaker interrogation- reviewed in detail today,  See PACEART report ekg today reveals sinus rhythm with frequent PVCs, RBBB, LAHB    Assessment and Plan:  1. Symptomatic complete heart block Normal pacemaker function Though indication from initial device appears to be CHB, she does not V pace.  She does atrial pace 60% See Pace Art report No changes today  2. Paroxysmal atrial fibrillation Well controlled (afib burden is 1.5 % --> 2% burden for the past 2 years) On eliquis Labs  followed by Dr Quintin Alto per the patient's request  3. HTN Stable No change required today  Carelink Return to see me in a year  Thompson Grayer MD, [facility] 07/28/2017 9:05 AM

## 2017-08-10 DIAGNOSIS — Z825 Family history of asthma and other chronic lower respiratory diseases: Secondary | ICD-10-CM | POA: Diagnosis not present

## 2017-08-10 DIAGNOSIS — E785 Hyperlipidemia, unspecified: Secondary | ICD-10-CM | POA: Diagnosis not present

## 2017-08-10 DIAGNOSIS — M810 Age-related osteoporosis without current pathological fracture: Secondary | ICD-10-CM | POA: Diagnosis not present

## 2017-08-10 DIAGNOSIS — Z950 Presence of cardiac pacemaker: Secondary | ICD-10-CM | POA: Diagnosis not present

## 2017-08-10 DIAGNOSIS — Z809 Family history of malignant neoplasm, unspecified: Secondary | ICD-10-CM | POA: Diagnosis not present

## 2017-08-10 DIAGNOSIS — I4891 Unspecified atrial fibrillation: Secondary | ICD-10-CM | POA: Diagnosis not present

## 2017-08-10 DIAGNOSIS — Z833 Family history of diabetes mellitus: Secondary | ICD-10-CM | POA: Diagnosis not present

## 2017-08-10 DIAGNOSIS — Z8249 Family history of ischemic heart disease and other diseases of the circulatory system: Secondary | ICD-10-CM | POA: Diagnosis not present

## 2017-08-10 DIAGNOSIS — I10 Essential (primary) hypertension: Secondary | ICD-10-CM | POA: Diagnosis not present

## 2017-08-10 DIAGNOSIS — Z7901 Long term (current) use of anticoagulants: Secondary | ICD-10-CM | POA: Diagnosis not present

## 2017-08-29 ENCOUNTER — Ambulatory Visit (INDEPENDENT_AMBULATORY_CARE_PROVIDER_SITE_OTHER): Payer: Medicare HMO | Admitting: *Deleted

## 2017-08-29 DIAGNOSIS — I442 Atrioventricular block, complete: Secondary | ICD-10-CM | POA: Diagnosis not present

## 2017-08-29 NOTE — Progress Notes (Signed)
Remote pacemaker transmission.   

## 2017-08-30 ENCOUNTER — Encounter: Payer: Self-pay | Admitting: Cardiology

## 2017-08-30 ENCOUNTER — Other Ambulatory Visit: Payer: Self-pay | Admitting: Internal Medicine

## 2017-08-30 NOTE — Telephone Encounter (Addendum)
Eliquis 5mg  refill request received; pt is 83 yrs old, wt-73.8kg, last seen by Dr. Rayann Heman on 07/28/17, last Creatinine done on 04/30/2013-which is overdue; therefore called PCP Dr. Edythe Lynn office & spoke with Aldona Bar & she would send latest labs, Last Crea-1.11 on 02/27/17. Will send in refill to requested pharmacy.

## 2017-09-04 DIAGNOSIS — E782 Mixed hyperlipidemia: Secondary | ICD-10-CM | POA: Diagnosis not present

## 2017-09-04 DIAGNOSIS — N183 Chronic kidney disease, stage 3 (moderate): Secondary | ICD-10-CM | POA: Diagnosis not present

## 2017-09-04 DIAGNOSIS — I10 Essential (primary) hypertension: Secondary | ICD-10-CM | POA: Diagnosis not present

## 2017-09-04 DIAGNOSIS — E7800 Pure hypercholesterolemia, unspecified: Secondary | ICD-10-CM | POA: Diagnosis not present

## 2017-09-04 DIAGNOSIS — R7301 Impaired fasting glucose: Secondary | ICD-10-CM | POA: Diagnosis not present

## 2017-09-07 DIAGNOSIS — E782 Mixed hyperlipidemia: Secondary | ICD-10-CM | POA: Diagnosis not present

## 2017-09-07 DIAGNOSIS — I482 Chronic atrial fibrillation: Secondary | ICD-10-CM | POA: Diagnosis not present

## 2017-09-07 DIAGNOSIS — R7301 Impaired fasting glucose: Secondary | ICD-10-CM | POA: Diagnosis not present

## 2017-09-07 DIAGNOSIS — Z6828 Body mass index (BMI) 28.0-28.9, adult: Secondary | ICD-10-CM | POA: Diagnosis not present

## 2017-09-07 DIAGNOSIS — D485 Neoplasm of uncertain behavior of skin: Secondary | ICD-10-CM | POA: Diagnosis not present

## 2017-09-07 DIAGNOSIS — I10 Essential (primary) hypertension: Secondary | ICD-10-CM | POA: Diagnosis not present

## 2017-09-07 DIAGNOSIS — N183 Chronic kidney disease, stage 3 (moderate): Secondary | ICD-10-CM | POA: Diagnosis not present

## 2017-09-08 ENCOUNTER — Encounter: Payer: Self-pay | Admitting: Internal Medicine

## 2017-09-12 DIAGNOSIS — L821 Other seborrheic keratosis: Secondary | ICD-10-CM | POA: Diagnosis not present

## 2017-09-12 DIAGNOSIS — D485 Neoplasm of uncertain behavior of skin: Secondary | ICD-10-CM | POA: Diagnosis not present

## 2017-09-19 DIAGNOSIS — I739 Peripheral vascular disease, unspecified: Secondary | ICD-10-CM | POA: Diagnosis not present

## 2017-09-19 DIAGNOSIS — R69 Illness, unspecified: Secondary | ICD-10-CM | POA: Diagnosis not present

## 2017-09-20 LAB — CUP PACEART REMOTE DEVICE CHECK
Battery Impedance: 276 Ohm
Battery Remaining Longevity: 115 mo
Battery Voltage: 2.78 V
Brady Statistic AP VP Percent: 1 %
Brady Statistic AP VS Percent: 63 %
Brady Statistic AS VP Percent: 0 %
Brady Statistic AS VS Percent: 35 %
Date Time Interrogation Session: 20190205175108
Implantable Lead Implant Date: 20050805
Implantable Lead Implant Date: 20050805
Implantable Lead Location: 753859
Implantable Lead Location: 753860
Implantable Lead Model: 5076
Implantable Lead Model: 5076
Implantable Pulse Generator Implant Date: 20140627
Lead Channel Impedance Value: 459 Ohm
Lead Channel Impedance Value: 838 Ohm
Lead Channel Pacing Threshold Amplitude: 0.625 V
Lead Channel Pacing Threshold Amplitude: 0.75 V
Lead Channel Pacing Threshold Pulse Width: 0.4 ms
Lead Channel Pacing Threshold Pulse Width: 0.4 ms
Lead Channel Setting Pacing Amplitude: 2 V
Lead Channel Setting Pacing Amplitude: 2.5 V
Lead Channel Setting Pacing Pulse Width: 0.4 ms
Lead Channel Setting Sensing Sensitivity: 2.8 mV

## 2017-09-28 DIAGNOSIS — C44319 Basal cell carcinoma of skin of other parts of face: Secondary | ICD-10-CM | POA: Diagnosis not present

## 2017-09-28 DIAGNOSIS — C4442 Squamous cell carcinoma of skin of scalp and neck: Secondary | ICD-10-CM | POA: Diagnosis not present

## 2017-11-03 NOTE — Telephone Encounter (Signed)
This encounter was created in error - please disregard.

## 2017-11-28 ENCOUNTER — Encounter: Payer: Medicare HMO | Admitting: *Deleted

## 2017-11-28 ENCOUNTER — Telehealth: Payer: Self-pay | Admitting: Cardiology

## 2017-11-28 NOTE — Telephone Encounter (Signed)
LMOVM reminding pt to send remote transmission.   

## 2017-11-30 ENCOUNTER — Encounter: Payer: Self-pay | Admitting: Cardiology

## 2017-12-12 ENCOUNTER — Ambulatory Visit (INDEPENDENT_AMBULATORY_CARE_PROVIDER_SITE_OTHER): Payer: Medicare HMO | Admitting: *Deleted

## 2017-12-12 DIAGNOSIS — I442 Atrioventricular block, complete: Secondary | ICD-10-CM

## 2017-12-12 NOTE — Progress Notes (Signed)
Remote pacemaker transmission.   

## 2017-12-22 LAB — CUP PACEART REMOTE DEVICE CHECK
Battery Impedance: 276 Ohm
Battery Remaining Longevity: 116 mo
Battery Voltage: 2.78 V
Brady Statistic AP VP Percent: 1 %
Brady Statistic AP VS Percent: 63 %
Brady Statistic AS VP Percent: 0 %
Brady Statistic AS VS Percent: 36 %
Date Time Interrogation Session: 20190521150508
Implantable Lead Implant Date: 20050805
Implantable Lead Implant Date: 20050805
Implantable Lead Location: 753859
Implantable Lead Location: 753860
Implantable Lead Model: 5076
Implantable Lead Model: 5076
Implantable Pulse Generator Implant Date: 20140627
Lead Channel Impedance Value: 472 Ohm
Lead Channel Impedance Value: 835 Ohm
Lead Channel Pacing Threshold Amplitude: 0.625 V
Lead Channel Pacing Threshold Amplitude: 0.75 V
Lead Channel Pacing Threshold Pulse Width: 0.4 ms
Lead Channel Pacing Threshold Pulse Width: 0.4 ms
Lead Channel Setting Pacing Amplitude: 2 V
Lead Channel Setting Pacing Amplitude: 2.5 V
Lead Channel Setting Pacing Pulse Width: 0.4 ms
Lead Channel Setting Sensing Sensitivity: 2.8 mV

## 2017-12-25 DIAGNOSIS — Z0100 Encounter for examination of eyes and vision without abnormal findings: Secondary | ICD-10-CM | POA: Diagnosis not present

## 2017-12-25 DIAGNOSIS — I10 Essential (primary) hypertension: Secondary | ICD-10-CM | POA: Diagnosis not present

## 2017-12-25 DIAGNOSIS — H26491 Other secondary cataract, right eye: Secondary | ICD-10-CM | POA: Diagnosis not present

## 2018-01-23 DIAGNOSIS — L570 Actinic keratosis: Secondary | ICD-10-CM | POA: Diagnosis not present

## 2018-01-23 DIAGNOSIS — D485 Neoplasm of uncertain behavior of skin: Secondary | ICD-10-CM | POA: Diagnosis not present

## 2018-01-23 DIAGNOSIS — D225 Melanocytic nevi of trunk: Secondary | ICD-10-CM | POA: Diagnosis not present

## 2018-03-02 DIAGNOSIS — I482 Chronic atrial fibrillation: Secondary | ICD-10-CM | POA: Diagnosis not present

## 2018-03-02 DIAGNOSIS — E7801 Familial hypercholesterolemia: Secondary | ICD-10-CM | POA: Diagnosis not present

## 2018-03-02 DIAGNOSIS — I10 Essential (primary) hypertension: Secondary | ICD-10-CM | POA: Diagnosis not present

## 2018-03-02 DIAGNOSIS — R7301 Impaired fasting glucose: Secondary | ICD-10-CM | POA: Diagnosis not present

## 2018-03-02 DIAGNOSIS — E782 Mixed hyperlipidemia: Secondary | ICD-10-CM | POA: Diagnosis not present

## 2018-03-06 DIAGNOSIS — I10 Essential (primary) hypertension: Secondary | ICD-10-CM | POA: Diagnosis not present

## 2018-03-06 DIAGNOSIS — Z1389 Encounter for screening for other disorder: Secondary | ICD-10-CM | POA: Diagnosis not present

## 2018-03-06 DIAGNOSIS — Z6828 Body mass index (BMI) 28.0-28.9, adult: Secondary | ICD-10-CM | POA: Diagnosis not present

## 2018-03-06 DIAGNOSIS — R7301 Impaired fasting glucose: Secondary | ICD-10-CM | POA: Diagnosis not present

## 2018-03-06 DIAGNOSIS — E782 Mixed hyperlipidemia: Secondary | ICD-10-CM | POA: Diagnosis not present

## 2018-03-06 DIAGNOSIS — N183 Chronic kidney disease, stage 3 (moderate): Secondary | ICD-10-CM | POA: Diagnosis not present

## 2018-03-06 DIAGNOSIS — I482 Chronic atrial fibrillation: Secondary | ICD-10-CM | POA: Diagnosis not present

## 2018-03-06 DIAGNOSIS — Z1331 Encounter for screening for depression: Secondary | ICD-10-CM | POA: Diagnosis not present

## 2018-03-13 ENCOUNTER — Ambulatory Visit (INDEPENDENT_AMBULATORY_CARE_PROVIDER_SITE_OTHER): Payer: Medicare HMO | Admitting: *Deleted

## 2018-03-13 DIAGNOSIS — I442 Atrioventricular block, complete: Secondary | ICD-10-CM | POA: Diagnosis not present

## 2018-03-14 NOTE — Progress Notes (Signed)
Remote pacemaker transmission.   

## 2018-03-28 DIAGNOSIS — I739 Peripheral vascular disease, unspecified: Secondary | ICD-10-CM | POA: Diagnosis not present

## 2018-03-28 DIAGNOSIS — R69 Illness, unspecified: Secondary | ICD-10-CM | POA: Diagnosis not present

## 2018-04-08 ENCOUNTER — Other Ambulatory Visit: Payer: Self-pay | Admitting: Internal Medicine

## 2018-04-11 DIAGNOSIS — R69 Illness, unspecified: Secondary | ICD-10-CM | POA: Diagnosis not present

## 2018-04-16 LAB — CUP PACEART REMOTE DEVICE CHECK
Battery Impedance: 300 Ohm
Battery Remaining Longevity: 112 mo
Battery Voltage: 2.78 V
Brady Statistic AP VP Percent: 1 %
Brady Statistic AP VS Percent: 61 %
Brady Statistic AS VP Percent: 0 %
Brady Statistic AS VS Percent: 38 %
Date Time Interrogation Session: 20190819170913
Implantable Lead Implant Date: 20050805
Implantable Lead Implant Date: 20050805
Implantable Lead Location: 753859
Implantable Lead Location: 753860
Implantable Lead Model: 5076
Implantable Lead Model: 5076
Implantable Pulse Generator Implant Date: 20140627
Lead Channel Impedance Value: 446 Ohm
Lead Channel Impedance Value: 786 Ohm
Lead Channel Pacing Threshold Amplitude: 0.75 V
Lead Channel Pacing Threshold Amplitude: 0.875 V
Lead Channel Pacing Threshold Pulse Width: 0.4 ms
Lead Channel Pacing Threshold Pulse Width: 0.4 ms
Lead Channel Sensing Intrinsic Amplitude: 1.4 mV
Lead Channel Sensing Intrinsic Amplitude: 5.6 mV
Lead Channel Setting Pacing Amplitude: 2 V
Lead Channel Setting Pacing Amplitude: 2.5 V
Lead Channel Setting Pacing Pulse Width: 0.4 ms
Lead Channel Setting Sensing Sensitivity: 2 mV

## 2018-05-28 DIAGNOSIS — D485 Neoplasm of uncertain behavior of skin: Secondary | ICD-10-CM | POA: Diagnosis not present

## 2018-05-28 DIAGNOSIS — L570 Actinic keratosis: Secondary | ICD-10-CM | POA: Diagnosis not present

## 2018-05-28 DIAGNOSIS — C44311 Basal cell carcinoma of skin of nose: Secondary | ICD-10-CM | POA: Diagnosis not present

## 2018-06-12 ENCOUNTER — Ambulatory Visit (INDEPENDENT_AMBULATORY_CARE_PROVIDER_SITE_OTHER): Payer: Medicare HMO

## 2018-06-12 ENCOUNTER — Telehealth: Payer: Self-pay

## 2018-06-12 DIAGNOSIS — I10 Essential (primary) hypertension: Secondary | ICD-10-CM

## 2018-06-12 DIAGNOSIS — I442 Atrioventricular block, complete: Secondary | ICD-10-CM

## 2018-06-12 NOTE — Telephone Encounter (Signed)
Spoke with pt and reminded pt of remote transmission that is due today. Pt verbalized understanding.   

## 2018-06-13 NOTE — Progress Notes (Signed)
Remote pacemaker transmission.   

## 2018-06-14 DIAGNOSIS — C44311 Basal cell carcinoma of skin of nose: Secondary | ICD-10-CM | POA: Diagnosis not present

## 2018-07-04 DIAGNOSIS — Z85828 Personal history of other malignant neoplasm of skin: Secondary | ICD-10-CM | POA: Diagnosis not present

## 2018-07-04 DIAGNOSIS — L570 Actinic keratosis: Secondary | ICD-10-CM | POA: Diagnosis not present

## 2018-07-04 DIAGNOSIS — L819 Disorder of pigmentation, unspecified: Secondary | ICD-10-CM | POA: Diagnosis not present

## 2018-07-27 ENCOUNTER — Encounter: Payer: Medicare HMO | Admitting: Internal Medicine

## 2018-08-03 ENCOUNTER — Encounter: Payer: Self-pay | Admitting: Internal Medicine

## 2018-08-03 ENCOUNTER — Ambulatory Visit: Payer: Medicare HMO | Admitting: Internal Medicine

## 2018-08-03 VITALS — BP 152/78 | HR 88 | Ht 63.0 in | Wt 159.4 lb

## 2018-08-03 DIAGNOSIS — Z950 Presence of cardiac pacemaker: Secondary | ICD-10-CM | POA: Diagnosis not present

## 2018-08-03 DIAGNOSIS — I4891 Unspecified atrial fibrillation: Secondary | ICD-10-CM | POA: Diagnosis not present

## 2018-08-03 DIAGNOSIS — I442 Atrioventricular block, complete: Secondary | ICD-10-CM | POA: Diagnosis not present

## 2018-08-03 LAB — CUP PACEART INCLINIC DEVICE CHECK
Battery Impedance: 324 Ohm
Battery Remaining Longevity: 109 mo
Battery Voltage: 2.78 V
Brady Statistic AP VP Percent: 1 %
Brady Statistic AP VS Percent: 61 %
Brady Statistic AS VP Percent: 0 %
Brady Statistic AS VS Percent: 39 %
Date Time Interrogation Session: 20200110150601
Implantable Lead Implant Date: 20050805
Implantable Lead Implant Date: 20050805
Implantable Lead Location: 753859
Implantable Lead Location: 753860
Implantable Lead Model: 5076
Implantable Lead Model: 5076
Implantable Pulse Generator Implant Date: 20140627
Lead Channel Impedance Value: 454 Ohm
Lead Channel Impedance Value: 718 Ohm
Lead Channel Pacing Threshold Amplitude: 0.75 V
Lead Channel Pacing Threshold Amplitude: 0.75 V
Lead Channel Pacing Threshold Pulse Width: 0.4 ms
Lead Channel Pacing Threshold Pulse Width: 0.4 ms
Lead Channel Sensing Intrinsic Amplitude: 2 mV
Lead Channel Sensing Intrinsic Amplitude: 5.6 mV
Lead Channel Setting Pacing Amplitude: 2 V
Lead Channel Setting Pacing Amplitude: 2.5 V
Lead Channel Setting Pacing Pulse Width: 0.4 ms
Lead Channel Setting Sensing Sensitivity: 2.8 mV

## 2018-08-03 MED ORDER — APIXABAN 5 MG PO TABS: 5.0000 mg | ORAL_TABLET | Freq: Two times a day (BID) | ORAL | 0 refills | Status: DC

## 2018-08-03 NOTE — Patient Instructions (Signed)
Your physician wants you to follow-up in: 1 YEAR WITH DR ALLRED You will receive a reminder letter in the mail two months in advance. If you don't receive a letter, please call our office to schedule the follow-up appointment.  Your physician recommends that you continue on your current medications as directed. Please refer to the Current Medication list given to you today.  Thank you for choosing [facility]!!   

## 2018-08-03 NOTE — Progress Notes (Signed)
    PCP: Manon Hilding, MD   Primary EP:  Dr Rayann Heman  Cassandra Young is a 84 y.o. female who presents today for routine electrophysiology followup.  Since last being seen in our clinic, the patient reports doing very well.  Today, she denies symptoms of palpitations, chest pain, shortness of breath,  lower extremity edema, dizziness, presyncope, or syncope.  The patient is otherwise without complaint today.   Past Medical History:  Diagnosis Date  . Atrioventricular block, complete [facility])    s/p PPM implant originally in 2005 with generator change 2014 (MDT ADDR1 by Dr Rayann Heman)  . Esophageal reflux   . Hypertension   . Pacemaker   . Paroxysmal atrial fibrillation [facility])    Past Surgical History:  Procedure Laterality Date  . CATARACT EXTRACTION W/PHACO Right 05/06/2013   Procedure: CATARACT EXTRACTION PHACO AND INTRAOCULAR LENS PLACEMENT (IOC) CDE=11.18;  Surgeon: Tonny Branch, MD;  Location: [facility];  Service: Ophthalmology;  Laterality: Right;  . CATARACT EXTRACTION W/PHACO Left 05/30/2013   Procedure: CATARACT EXTRACTION PHACO AND INTRAOCULAR LENS PLACEMENT (IOC);  Surgeon: Tonny Branch, MD;  Location: [facility];  Service: Ophthalmology;  Laterality: Left;  CDE:13.93  . PACEMAKER GENERATOR CHANGE  01/18/2013   MDT ADDR1 implanted by Dr Rayann Heman  . PACEMAKER INSERTION  2005   Medtronic Kappa [id number]  . PERMANENT PACEMAKER GENERATOR CHANGE Left 01/18/2013   Procedure: PERMANENT PACEMAKER GENERATOR CHANGE;  Surgeon: Thompson Grayer, MD;  Location: [facility] CATH LAB;  Service: Cardiovascular;  Laterality: Left;    ROS- all systems are reviewed and negative except as per HPI above  Current Outpatient Medications  Medication Sig Dispense Refill  . Calcium Carbonate-Vit D-Min 600-400 MG-UNIT TABS Take 2 tablets by mouth daily.      Marland Kitchen ELIQUIS 5 MG TABS tablet TAKE 1 TABLET BY MOUTH TWICE A DAY 60 tablet 5  . fish oil-omega-3 fatty acids 1000 MG capsule Take 1 g by mouth 2 (two) times daily.     Marland Kitchen  glucosamine-chondroitin 500-400 MG tablet Take 1 tablet by mouth daily.      . metoprolol tartrate (LOPRESSOR) 25 MG tablet Take 25 mg by mouth 2 (two) times daily.      . pravastatin (PRAVACHOL) 20 MG tablet Take 20 mg by mouth daily.     No current facility-administered medications for this visit.     Physical Exam: Vitals:   08/03/18 0939  BP: (!) 152/78  Pulse: 88  SpO2: 97%  Weight: 159 lb 6.4 oz (72.3 kg)  Height: 5\' 3"  (1.6 m)    GEN- The patient is well appearing, alert and oriented x 3 today.   Head- normocephalic, atraumatic Eyes-  Sclera clear, conjunctiva pink Ears- hearing intact Oropharynx- clear Lungs- Clear to ausculation bilaterally, normal work of breathing Chest- pacemaker pocket is well healed Heart- Regular rate and rhythm, no murmurs, rubs or gallops, PMI not laterally displaced GI- soft, NT, ND, + BS Extremities- no clubbing, cyanosis, or edema  Pacemaker interrogation- reviewed in detail today,  See PACEART report   Assessment and Plan:  1. Symptomatic transient complete heart block with sinus node dysfunction Normal pacemaker function See Pace Art report No changes today She rarely V paces  2. Paroxysmal atrial fibrillation AF burden is 1.2% On eliquis  3. HTN BP elevated today She reports good control at home and does not wish to make changes  Carelink Return in a year  Thompson Grayer MD, [facility] 08/03/2018 10:27 AM

## 2018-08-03 NOTE — Addendum Note (Signed)
Addended by: Julian Hy T on: 08/03/2018 10:32 AM   Modules accepted: Orders

## 2018-08-08 LAB — CUP PACEART REMOTE DEVICE CHECK
Battery Impedance: 324 Ohm
Battery Remaining Longevity: 110 mo
Battery Voltage: 2.78 V
Brady Statistic AP VP Percent: 1 %
Brady Statistic AP VS Percent: 61 %
Brady Statistic AS VP Percent: 0 %
Brady Statistic AS VS Percent: 38 %
Date Time Interrogation Session: 20191119174051
Implantable Lead Implant Date: 20050805
Implantable Lead Implant Date: 20050805
Implantable Lead Location: 753859
Implantable Lead Location: 753860
Implantable Lead Model: 5076
Implantable Lead Model: 5076
Implantable Pulse Generator Implant Date: 20140627
Lead Channel Impedance Value: 459 Ohm
Lead Channel Impedance Value: 882 Ohm
Lead Channel Pacing Threshold Amplitude: 0.625 V
Lead Channel Pacing Threshold Amplitude: 0.75 V
Lead Channel Pacing Threshold Pulse Width: 0.4 ms
Lead Channel Pacing Threshold Pulse Width: 0.4 ms
Lead Channel Setting Pacing Amplitude: 2 V
Lead Channel Setting Pacing Amplitude: 2.5 V
Lead Channel Setting Pacing Pulse Width: 0.4 ms
Lead Channel Setting Sensing Sensitivity: 2.8 mV

## 2018-08-31 DIAGNOSIS — E7801 Familial hypercholesterolemia: Secondary | ICD-10-CM | POA: Diagnosis not present

## 2018-08-31 DIAGNOSIS — N183 Chronic kidney disease, stage 3 (moderate): Secondary | ICD-10-CM | POA: Diagnosis not present

## 2018-08-31 DIAGNOSIS — E782 Mixed hyperlipidemia: Secondary | ICD-10-CM | POA: Diagnosis not present

## 2018-08-31 DIAGNOSIS — R7301 Impaired fasting glucose: Secondary | ICD-10-CM | POA: Diagnosis not present

## 2018-08-31 DIAGNOSIS — I10 Essential (primary) hypertension: Secondary | ICD-10-CM | POA: Diagnosis not present

## 2018-09-05 DIAGNOSIS — N183 Chronic kidney disease, stage 3 (moderate): Secondary | ICD-10-CM | POA: Diagnosis not present

## 2018-09-05 DIAGNOSIS — E782 Mixed hyperlipidemia: Secondary | ICD-10-CM | POA: Diagnosis not present

## 2018-09-05 DIAGNOSIS — R7301 Impaired fasting glucose: Secondary | ICD-10-CM | POA: Diagnosis not present

## 2018-09-05 DIAGNOSIS — I482 Chronic atrial fibrillation, unspecified: Secondary | ICD-10-CM | POA: Diagnosis not present

## 2018-09-05 DIAGNOSIS — Z6828 Body mass index (BMI) 28.0-28.9, adult: Secondary | ICD-10-CM | POA: Diagnosis not present

## 2018-09-05 DIAGNOSIS — I10 Essential (primary) hypertension: Secondary | ICD-10-CM | POA: Diagnosis not present

## 2018-09-12 ENCOUNTER — Telehealth: Payer: Self-pay

## 2018-09-12 NOTE — Telephone Encounter (Signed)
Left message for patient to remind of missed remote transmission.  

## 2018-09-21 ENCOUNTER — Encounter: Payer: Self-pay | Admitting: Cardiology

## 2018-09-28 ENCOUNTER — Ambulatory Visit (INDEPENDENT_AMBULATORY_CARE_PROVIDER_SITE_OTHER): Payer: Medicare HMO | Admitting: *Deleted

## 2018-09-28 DIAGNOSIS — I442 Atrioventricular block, complete: Secondary | ICD-10-CM | POA: Diagnosis not present

## 2018-09-28 DIAGNOSIS — I4891 Unspecified atrial fibrillation: Secondary | ICD-10-CM

## 2018-09-29 LAB — CUP PACEART REMOTE DEVICE CHECK
Battery Impedance: 373 Ohm
Battery Remaining Longevity: 103 mo
Battery Voltage: 2.78 V
Brady Statistic AP VP Percent: 16 %
Brady Statistic AP VS Percent: 46 %
Brady Statistic AS VP Percent: 6 %
Brady Statistic AS VS Percent: 31 %
Date Time Interrogation Session: 20200306203200
Implantable Lead Implant Date: 20050805
Implantable Lead Implant Date: 20050805
Implantable Lead Location: 753859
Implantable Lead Location: 753860
Implantable Lead Model: 5076
Implantable Lead Model: 5076
Implantable Pulse Generator Implant Date: 20140627
Lead Channel Impedance Value: 479 Ohm
Lead Channel Impedance Value: 803 Ohm
Lead Channel Pacing Threshold Amplitude: 0.625 V
Lead Channel Pacing Threshold Amplitude: 0.75 V
Lead Channel Pacing Threshold Pulse Width: 0.4 ms
Lead Channel Pacing Threshold Pulse Width: 0.4 ms
Lead Channel Setting Pacing Amplitude: 2 V
Lead Channel Setting Pacing Amplitude: 2.5 V
Lead Channel Setting Pacing Pulse Width: 0.4 ms
Lead Channel Setting Sensing Sensitivity: 2 mV

## 2018-10-03 DIAGNOSIS — R69 Illness, unspecified: Secondary | ICD-10-CM | POA: Diagnosis not present

## 2018-10-04 DIAGNOSIS — R69 Illness, unspecified: Secondary | ICD-10-CM | POA: Diagnosis not present

## 2018-10-05 NOTE — Progress Notes (Addendum)
Remote pacemaker transmission.   

## 2018-10-24 ENCOUNTER — Other Ambulatory Visit: Payer: Self-pay | Admitting: Internal Medicine

## 2018-10-24 NOTE — Telephone Encounter (Signed)
Eliquis 5mg  refill request received; pt is 84 yrs old, wt-72.3kg, Crea-0.96 on 08/31/2018, last seen by Dr. Rayann Heman on 08/03/2018; will send refill as requested.

## 2018-11-21 DIAGNOSIS — L570 Actinic keratosis: Secondary | ICD-10-CM | POA: Diagnosis not present

## 2018-11-21 DIAGNOSIS — D485 Neoplasm of uncertain behavior of skin: Secondary | ICD-10-CM | POA: Diagnosis not present

## 2018-11-21 DIAGNOSIS — C44319 Basal cell carcinoma of skin of other parts of face: Secondary | ICD-10-CM | POA: Diagnosis not present

## 2018-11-29 DIAGNOSIS — C44319 Basal cell carcinoma of skin of other parts of face: Secondary | ICD-10-CM | POA: Diagnosis not present

## 2018-12-28 ENCOUNTER — Telehealth: Payer: Self-pay

## 2018-12-28 ENCOUNTER — Ambulatory Visit (INDEPENDENT_AMBULATORY_CARE_PROVIDER_SITE_OTHER): Payer: Medicare HMO | Admitting: *Deleted

## 2018-12-28 DIAGNOSIS — I442 Atrioventricular block, complete: Secondary | ICD-10-CM

## 2018-12-28 LAB — CUP PACEART REMOTE DEVICE CHECK
Battery Impedance: 421 Ohm
Battery Remaining Longevity: 93 mo
Battery Voltage: 2.78 V
Brady Statistic AP VP Percent: 47 %
Brady Statistic AP VS Percent: 18 %
Brady Statistic AS VP Percent: 23 %
Brady Statistic AS VS Percent: 12 %
Date Time Interrogation Session: 20200605175619
Implantable Lead Implant Date: 20050805
Implantable Lead Implant Date: 20050805
Implantable Lead Location: 753859
Implantable Lead Location: 753860
Implantable Lead Model: 5076
Implantable Lead Model: 5076
Implantable Pulse Generator Implant Date: 20140627
Lead Channel Impedance Value: 472 Ohm
Lead Channel Impedance Value: 779 Ohm
Lead Channel Pacing Threshold Amplitude: 0.75 V
Lead Channel Pacing Threshold Amplitude: 0.75 V
Lead Channel Pacing Threshold Pulse Width: 0.4 ms
Lead Channel Pacing Threshold Pulse Width: 0.4 ms
Lead Channel Setting Pacing Amplitude: 2 V
Lead Channel Setting Pacing Amplitude: 2.5 V
Lead Channel Setting Pacing Pulse Width: 0.4 ms
Lead Channel Setting Sensing Sensitivity: 2 mV

## 2018-12-28 NOTE — Telephone Encounter (Signed)
Left message for patient to remind of missed remote transmission.  

## 2019-01-04 ENCOUNTER — Encounter: Payer: Self-pay | Admitting: Cardiology

## 2019-01-04 NOTE — Progress Notes (Signed)
Remote pacemaker transmission.   

## 2019-02-28 DIAGNOSIS — E7801 Familial hypercholesterolemia: Secondary | ICD-10-CM | POA: Diagnosis not present

## 2019-02-28 DIAGNOSIS — R7301 Impaired fasting glucose: Secondary | ICD-10-CM | POA: Diagnosis not present

## 2019-02-28 DIAGNOSIS — E782 Mixed hyperlipidemia: Secondary | ICD-10-CM | POA: Diagnosis not present

## 2019-02-28 DIAGNOSIS — I10 Essential (primary) hypertension: Secondary | ICD-10-CM | POA: Diagnosis not present

## 2019-02-28 DIAGNOSIS — N183 Chronic kidney disease, stage 3 (moderate): Secondary | ICD-10-CM | POA: Diagnosis not present

## 2019-03-04 DIAGNOSIS — R7301 Impaired fasting glucose: Secondary | ICD-10-CM | POA: Diagnosis not present

## 2019-03-04 DIAGNOSIS — N183 Chronic kidney disease, stage 3 (moderate): Secondary | ICD-10-CM | POA: Diagnosis not present

## 2019-03-04 DIAGNOSIS — Z23 Encounter for immunization: Secondary | ICD-10-CM | POA: Diagnosis not present

## 2019-03-04 DIAGNOSIS — E782 Mixed hyperlipidemia: Secondary | ICD-10-CM | POA: Diagnosis not present

## 2019-03-04 DIAGNOSIS — I4891 Unspecified atrial fibrillation: Secondary | ICD-10-CM | POA: Diagnosis not present

## 2019-03-04 DIAGNOSIS — Z6828 Body mass index (BMI) 28.0-28.9, adult: Secondary | ICD-10-CM | POA: Diagnosis not present

## 2019-03-04 DIAGNOSIS — I10 Essential (primary) hypertension: Secondary | ICD-10-CM | POA: Diagnosis not present

## 2019-03-19 DIAGNOSIS — L570 Actinic keratosis: Secondary | ICD-10-CM | POA: Diagnosis not present

## 2019-03-25 DIAGNOSIS — E782 Mixed hyperlipidemia: Secondary | ICD-10-CM | POA: Diagnosis not present

## 2019-03-25 DIAGNOSIS — I10 Essential (primary) hypertension: Secondary | ICD-10-CM | POA: Diagnosis not present

## 2019-03-29 ENCOUNTER — Ambulatory Visit (INDEPENDENT_AMBULATORY_CARE_PROVIDER_SITE_OTHER): Payer: Medicare HMO | Admitting: *Deleted

## 2019-03-29 DIAGNOSIS — I442 Atrioventricular block, complete: Secondary | ICD-10-CM

## 2019-03-31 LAB — CUP PACEART REMOTE DEVICE CHECK
Battery Impedance: 446 Ohm
Battery Remaining Longevity: 89 mo
Battery Voltage: 2.78 V
Brady Statistic AP VP Percent: 53 %
Brady Statistic AP VS Percent: 11 %
Brady Statistic AS VP Percent: 28 %
Brady Statistic AS VS Percent: 7 %
Date Time Interrogation Session: 20200904184001
Implantable Lead Implant Date: 20050805
Implantable Lead Implant Date: 20050805
Implantable Lead Location: 753859
Implantable Lead Location: 753860
Implantable Lead Model: 5076
Implantable Lead Model: 5076
Implantable Pulse Generator Implant Date: 20140627
Lead Channel Impedance Value: 435 Ohm
Lead Channel Impedance Value: 717 Ohm
Lead Channel Pacing Threshold Amplitude: 0.75 V
Lead Channel Pacing Threshold Amplitude: 0.875 V
Lead Channel Pacing Threshold Pulse Width: 0.4 ms
Lead Channel Pacing Threshold Pulse Width: 0.4 ms
Lead Channel Setting Pacing Amplitude: 2 V
Lead Channel Setting Pacing Amplitude: 2.5 V
Lead Channel Setting Pacing Pulse Width: 0.4 ms
Lead Channel Setting Sensing Sensitivity: 2 mV

## 2019-04-02 ENCOUNTER — Telehealth (HOSPITAL_COMMUNITY): Payer: Self-pay | Admitting: *Deleted

## 2019-04-02 NOTE — Telephone Encounter (Signed)
Received a VM requesting a return call I called back no answer/left vm requesting a return call.

## 2019-04-08 DIAGNOSIS — Z23 Encounter for immunization: Secondary | ICD-10-CM | POA: Diagnosis not present

## 2019-04-09 DIAGNOSIS — R69 Illness, unspecified: Secondary | ICD-10-CM | POA: Diagnosis not present

## 2019-04-10 ENCOUNTER — Encounter: Payer: Self-pay | Admitting: Cardiology

## 2019-04-10 NOTE — Progress Notes (Signed)
Remote pacemaker transmission.   

## 2019-04-24 DIAGNOSIS — E7800 Pure hypercholesterolemia, unspecified: Secondary | ICD-10-CM | POA: Diagnosis not present

## 2019-04-24 DIAGNOSIS — I482 Chronic atrial fibrillation, unspecified: Secondary | ICD-10-CM | POA: Diagnosis not present

## 2019-04-24 DIAGNOSIS — I10 Essential (primary) hypertension: Secondary | ICD-10-CM | POA: Diagnosis not present

## 2019-05-07 DIAGNOSIS — R69 Illness, unspecified: Secondary | ICD-10-CM | POA: Diagnosis not present

## 2019-05-28 DIAGNOSIS — H43813 Vitreous degeneration, bilateral: Secondary | ICD-10-CM | POA: Diagnosis not present

## 2019-05-28 DIAGNOSIS — Z0100 Encounter for examination of eyes and vision without abnormal findings: Secondary | ICD-10-CM | POA: Diagnosis not present

## 2019-06-24 DIAGNOSIS — E782 Mixed hyperlipidemia: Secondary | ICD-10-CM | POA: Diagnosis not present

## 2019-06-24 DIAGNOSIS — I10 Essential (primary) hypertension: Secondary | ICD-10-CM | POA: Diagnosis not present

## 2019-06-28 ENCOUNTER — Ambulatory Visit (INDEPENDENT_AMBULATORY_CARE_PROVIDER_SITE_OTHER): Payer: Medicare HMO | Admitting: *Deleted

## 2019-06-28 DIAGNOSIS — I442 Atrioventricular block, complete: Secondary | ICD-10-CM

## 2019-06-29 LAB — CUP PACEART REMOTE DEVICE CHECK
Battery Impedance: 519 Ohm
Battery Remaining Longevity: 81 mo
Battery Voltage: 2.78 V
Brady Statistic AP VP Percent: 55 %
Brady Statistic AP VS Percent: 8 %
Brady Statistic AS VP Percent: 32 %
Brady Statistic AS VS Percent: 5 %
Date Time Interrogation Session: 20201204151450
Implantable Lead Implant Date: 20050805
Implantable Lead Implant Date: 20050805
Implantable Lead Location: 753859
Implantable Lead Location: 753860
Implantable Lead Model: 5076
Implantable Lead Model: 5076
Implantable Pulse Generator Implant Date: 20140627
Lead Channel Impedance Value: 434 Ohm
Lead Channel Impedance Value: 657 Ohm
Lead Channel Pacing Threshold Amplitude: 0.75 V
Lead Channel Pacing Threshold Amplitude: 0.75 V
Lead Channel Pacing Threshold Pulse Width: 0.4 ms
Lead Channel Pacing Threshold Pulse Width: 0.4 ms
Lead Channel Setting Pacing Amplitude: 2 V
Lead Channel Setting Pacing Amplitude: 2.5 V
Lead Channel Setting Pacing Pulse Width: 0.4 ms
Lead Channel Setting Sensing Sensitivity: 2 mV

## 2019-07-12 DIAGNOSIS — Z008 Encounter for other general examination: Secondary | ICD-10-CM | POA: Diagnosis not present

## 2019-07-12 DIAGNOSIS — K5900 Constipation, unspecified: Secondary | ICD-10-CM | POA: Diagnosis not present

## 2019-07-12 DIAGNOSIS — D6869 Other thrombophilia: Secondary | ICD-10-CM | POA: Diagnosis not present

## 2019-07-12 DIAGNOSIS — I110 Hypertensive heart disease with heart failure: Secondary | ICD-10-CM | POA: Diagnosis not present

## 2019-07-12 DIAGNOSIS — K219 Gastro-esophageal reflux disease without esophagitis: Secondary | ICD-10-CM | POA: Diagnosis not present

## 2019-07-12 DIAGNOSIS — I25119 Atherosclerotic heart disease of native coronary artery with unspecified angina pectoris: Secondary | ICD-10-CM | POA: Diagnosis not present

## 2019-07-12 DIAGNOSIS — M810 Age-related osteoporosis without current pathological fracture: Secondary | ICD-10-CM | POA: Diagnosis not present

## 2019-07-12 DIAGNOSIS — E785 Hyperlipidemia, unspecified: Secondary | ICD-10-CM | POA: Diagnosis not present

## 2019-07-12 DIAGNOSIS — I509 Heart failure, unspecified: Secondary | ICD-10-CM | POA: Diagnosis not present

## 2019-07-12 DIAGNOSIS — I739 Peripheral vascular disease, unspecified: Secondary | ICD-10-CM | POA: Diagnosis not present

## 2019-07-12 DIAGNOSIS — I4891 Unspecified atrial fibrillation: Secondary | ICD-10-CM | POA: Diagnosis not present

## 2019-07-25 DIAGNOSIS — E782 Mixed hyperlipidemia: Secondary | ICD-10-CM | POA: Diagnosis not present

## 2019-07-25 DIAGNOSIS — I10 Essential (primary) hypertension: Secondary | ICD-10-CM | POA: Diagnosis not present

## 2019-08-02 ENCOUNTER — Telehealth (INDEPENDENT_AMBULATORY_CARE_PROVIDER_SITE_OTHER): Payer: Medicare HMO | Admitting: Internal Medicine

## 2019-08-02 ENCOUNTER — Encounter: Payer: Self-pay | Admitting: Internal Medicine

## 2019-08-02 VITALS — BP 144/66 | Ht 63.0 in | Wt 158.0 lb

## 2019-08-02 DIAGNOSIS — D6869 Other thrombophilia: Secondary | ICD-10-CM | POA: Diagnosis not present

## 2019-08-02 DIAGNOSIS — I442 Atrioventricular block, complete: Secondary | ICD-10-CM | POA: Diagnosis not present

## 2019-08-02 DIAGNOSIS — I480 Paroxysmal atrial fibrillation: Secondary | ICD-10-CM

## 2019-08-02 DIAGNOSIS — I10 Essential (primary) hypertension: Secondary | ICD-10-CM | POA: Diagnosis not present

## 2019-08-02 NOTE — Progress Notes (Signed)
Electrophysiology TeleHealth Note  Due to national recommendations of social distancing due to [place] 19, an audio telehealth visit is felt to be most appropriate for this patient at this time.  Verbal consent was obtained by me for the telehealth visit today.  The patient does not have capability for a virtual visit.  A phone visit is therefore required today.   Date:  08/02/2019   ID:  LATUNYA HALBERSTADT, DOB 1934-10-04, MRN [id number]  Location: patient's home  Provider location:  [place]  Evaluation Performed: Follow-up visit  PCP:  Manon Hilding, MD   Electrophysiologist:  Dr Rayann Heman  Chief Complaint:  palpitations  History of Present Illness:    SHAQUNDA BEDOYA is a 85 y.o. female who presents via telehealth conferencing today.  Since last being seen in our clinic, the patient reports doing very well.  She is exercising at the [facility].  Today, she denies symptoms of palpitations, chest pain, shortness of breath,  lower extremity edema, dizziness, presyncope, or syncope.  The patient is otherwise without complaint today.  The patient denies symptoms of fevers, chills, cough, or new SOB worrisome for COVID 19.  Past Medical History:  Diagnosis Date  . Atrioventricular block, complete [facility])    s/p PPM implant originally in 2005 with generator change 2014 (MDT ADDR1 by Dr Rayann Heman)  . Esophageal reflux   . Hypertension   . Pacemaker   . Paroxysmal atrial fibrillation [facility])     Past Surgical History:  Procedure Laterality Date  . CATARACT EXTRACTION W/PHACO Right 05/06/2013   Procedure: CATARACT EXTRACTION PHACO AND INTRAOCULAR LENS PLACEMENT (IOC) CDE=11.18;  Surgeon: Tonny Branch, MD;  Location: [facility];  Service: Ophthalmology;  Laterality: Right;  . CATARACT EXTRACTION W/PHACO Left 05/30/2013   Procedure: CATARACT EXTRACTION PHACO AND INTRAOCULAR LENS PLACEMENT (IOC);  Surgeon: Tonny Branch, MD;  Location: [facility];  Service: Ophthalmology;  Laterality: Left;  CDE:13.93  . PACEMAKER  GENERATOR CHANGE  01/18/2013   MDT ADDR1 implanted by Dr Rayann Heman  . PACEMAKER INSERTION  2005   Medtronic Kappa [id number]  . PERMANENT PACEMAKER GENERATOR CHANGE Left 01/18/2013   Procedure: PERMANENT PACEMAKER GENERATOR CHANGE;  Surgeon: Thompson Grayer, MD;  Location: [facility] CATH LAB;  Service: Cardiovascular;  Laterality: Left;    Current Outpatient Medications  Medication Sig Dispense Refill  . Calcium Carbonate-Vit D-Min 600-400 MG-UNIT TABS Take 2 tablets by mouth daily.      Marland Kitchen ELIQUIS 5 MG TABS tablet TAKE 1 TABLET BY MOUTH TWICE A DAY 60 tablet 9  . fish oil-omega-3 fatty acids 1000 MG capsule Take 1 g by mouth 2 (two) times daily.     Marland Kitchen glucosamine-chondroitin 500-400 MG tablet Take 1 tablet by mouth daily.      . metoprolol tartrate (LOPRESSOR) 25 MG tablet Take 25 mg by mouth 2 (two) times daily.      . rosuvastatin (CRESTOR) 5 MG tablet Take 5 mg by mouth every other day.     No current facility-administered medications for this visit.    Allergies:   Patient has no known allergies.   Social History:  The patient  reports that she has never smoked. She has never used smokeless tobacco. She reports that she does not drink alcohol or use drugs.   Family History:  The patient's family history includes Cancer in her mother; Stroke in her father.   ROS:  Please see the history of present illness.   All other systems are personally reviewed and  negative.    Exam:    Vital Signs:  BP (!) 144/66   Ht 5\' 3"  (1.6 m)   Wt 158 lb (71.7 kg)   BMI 27.99 kg/m   Well sounding, alert and conversant   Labs/Other Tests and Data Reviewed:    Recent Labs: No results found for requested labs within last 8760 hours.   Wt Readings from Last 3 Encounters:  08/02/19 158 lb (71.7 kg)  08/03/18 159 lb 6.4 oz (72.3 kg)  07/28/17 162 lb 9.6 oz (73.8 kg)     Last device remote is reviewed from [place] [employer] which reveals normal device function, afib burden is 1.9%   ASSESSMENT & PLAN:    1.   Sick sinus syndrome and transient complete heart block Remotes are uptodate Normal device function  2. Paroxysmal atrial fibrillation afib burden is  1.9 % (1.2% last year) She is on eliquis for chads2vasc score of 4  3. HTN Stable No change required today  Follow-up:  with me in a year   Patient Risk:  after full review of this patients clinical status, I feel that they are at moderate risk at this time.  Today, I have spent 15 minutes with the patient with telehealth technology discussing arrhythmia management .    Army Fossa, MD  08/02/2019 9:34 AM     [facility] [address] [place] [address] [phone] (office) [phone] (fax)

## 2019-08-23 DIAGNOSIS — I10 Essential (primary) hypertension: Secondary | ICD-10-CM | POA: Diagnosis not present

## 2019-08-23 DIAGNOSIS — I482 Chronic atrial fibrillation, unspecified: Secondary | ICD-10-CM | POA: Diagnosis not present

## 2019-08-30 DIAGNOSIS — E782 Mixed hyperlipidemia: Secondary | ICD-10-CM | POA: Diagnosis not present

## 2019-08-30 DIAGNOSIS — I10 Essential (primary) hypertension: Secondary | ICD-10-CM | POA: Diagnosis not present

## 2019-08-30 DIAGNOSIS — N183 Chronic kidney disease, stage 3 unspecified: Secondary | ICD-10-CM | POA: Diagnosis not present

## 2019-09-04 DIAGNOSIS — Z0001 Encounter for general adult medical examination with abnormal findings: Secondary | ICD-10-CM | POA: Diagnosis not present

## 2019-09-04 DIAGNOSIS — E782 Mixed hyperlipidemia: Secondary | ICD-10-CM | POA: Diagnosis not present

## 2019-09-04 DIAGNOSIS — R7301 Impaired fasting glucose: Secondary | ICD-10-CM | POA: Diagnosis not present

## 2019-09-04 DIAGNOSIS — Z6829 Body mass index (BMI) 29.0-29.9, adult: Secondary | ICD-10-CM | POA: Diagnosis not present

## 2019-09-04 DIAGNOSIS — I10 Essential (primary) hypertension: Secondary | ICD-10-CM | POA: Diagnosis not present

## 2019-09-17 DIAGNOSIS — L570 Actinic keratosis: Secondary | ICD-10-CM | POA: Diagnosis not present

## 2019-09-23 ENCOUNTER — Other Ambulatory Visit: Payer: Self-pay | Admitting: Internal Medicine

## 2019-09-23 NOTE — Telephone Encounter (Signed)
Pt last saw Dr Rayann Heman 08/02/19 telemedicine Covid-19, last labs 08/30/19 Creat 1.15 at [place] per [facility], Age 85, weight 71.7kg, based on specified criteria pt is on appropriate dosage of Eliquis 5mg  BID.  Will refill rx.

## 2019-09-27 ENCOUNTER — Ambulatory Visit (INDEPENDENT_AMBULATORY_CARE_PROVIDER_SITE_OTHER): Payer: Medicare HMO | Admitting: *Deleted

## 2019-09-27 DIAGNOSIS — I442 Atrioventricular block, complete: Secondary | ICD-10-CM | POA: Diagnosis not present

## 2019-09-27 LAB — CUP PACEART REMOTE DEVICE CHECK
Battery Impedance: 544 Ohm
Battery Remaining Longevity: 79 mo
Battery Voltage: 2.77 V
Brady Statistic AP VP Percent: 55 %
Brady Statistic AP VS Percent: 6 %
Brady Statistic AS VP Percent: 35 %
Brady Statistic AS VS Percent: 4 %
Date Time Interrogation Session: 20210305135745
Implantable Lead Implant Date: 20050805
Implantable Lead Implant Date: 20050805
Implantable Lead Location: 753859
Implantable Lead Location: 753860
Implantable Lead Model: 5076
Implantable Lead Model: 5076
Implantable Pulse Generator Implant Date: 20140627
Lead Channel Impedance Value: 434 Ohm
Lead Channel Impedance Value: 657 Ohm
Lead Channel Pacing Threshold Amplitude: 0.625 V
Lead Channel Pacing Threshold Amplitude: 0.75 V
Lead Channel Pacing Threshold Pulse Width: 0.4 ms
Lead Channel Pacing Threshold Pulse Width: 0.4 ms
Lead Channel Setting Pacing Amplitude: 2 V
Lead Channel Setting Pacing Amplitude: 2.5 V
Lead Channel Setting Pacing Pulse Width: 0.4 ms
Lead Channel Setting Sensing Sensitivity: 2 mV

## 2019-09-28 NOTE — Progress Notes (Signed)
PPM remote 

## 2019-10-23 DIAGNOSIS — I10 Essential (primary) hypertension: Secondary | ICD-10-CM | POA: Diagnosis not present

## 2019-10-23 DIAGNOSIS — E7849 Other hyperlipidemia: Secondary | ICD-10-CM | POA: Diagnosis not present

## 2019-10-29 DIAGNOSIS — R69 Illness, unspecified: Secondary | ICD-10-CM | POA: Diagnosis not present

## 2019-11-22 DIAGNOSIS — E7849 Other hyperlipidemia: Secondary | ICD-10-CM | POA: Diagnosis not present

## 2019-11-22 DIAGNOSIS — I10 Essential (primary) hypertension: Secondary | ICD-10-CM | POA: Diagnosis not present

## 2019-12-03 DIAGNOSIS — Z809 Family history of malignant neoplasm, unspecified: Secondary | ICD-10-CM | POA: Diagnosis not present

## 2019-12-03 DIAGNOSIS — Z8249 Family history of ischemic heart disease and other diseases of the circulatory system: Secondary | ICD-10-CM | POA: Diagnosis not present

## 2019-12-03 DIAGNOSIS — M810 Age-related osteoporosis without current pathological fracture: Secondary | ICD-10-CM | POA: Diagnosis not present

## 2019-12-03 DIAGNOSIS — E785 Hyperlipidemia, unspecified: Secondary | ICD-10-CM | POA: Diagnosis not present

## 2019-12-03 DIAGNOSIS — I4891 Unspecified atrial fibrillation: Secondary | ICD-10-CM | POA: Diagnosis not present

## 2019-12-03 DIAGNOSIS — Z85828 Personal history of other malignant neoplasm of skin: Secondary | ICD-10-CM | POA: Diagnosis not present

## 2019-12-03 DIAGNOSIS — D6869 Other thrombophilia: Secondary | ICD-10-CM | POA: Diagnosis not present

## 2019-12-03 DIAGNOSIS — Z7901 Long term (current) use of anticoagulants: Secondary | ICD-10-CM | POA: Diagnosis not present

## 2019-12-03 DIAGNOSIS — K219 Gastro-esophageal reflux disease without esophagitis: Secondary | ICD-10-CM | POA: Diagnosis not present

## 2019-12-03 DIAGNOSIS — I10 Essential (primary) hypertension: Secondary | ICD-10-CM | POA: Diagnosis not present

## 2019-12-27 ENCOUNTER — Ambulatory Visit (INDEPENDENT_AMBULATORY_CARE_PROVIDER_SITE_OTHER): Payer: Medicare HMO | Admitting: *Deleted

## 2019-12-27 ENCOUNTER — Telehealth: Payer: Self-pay

## 2019-12-27 DIAGNOSIS — I442 Atrioventricular block, complete: Secondary | ICD-10-CM | POA: Diagnosis not present

## 2019-12-27 LAB — CUP PACEART REMOTE DEVICE CHECK
Battery Impedance: 619 Ohm
Battery Remaining Longevity: 75 mo
Battery Voltage: 2.77 V
Brady Statistic AP VP Percent: 56 %
Brady Statistic AP VS Percent: 5 %
Brady Statistic AS VP Percent: 36 %
Brady Statistic AS VS Percent: 3 %
Date Time Interrogation Session: 20210604114418
Implantable Lead Implant Date: 20050805
Implantable Lead Implant Date: 20050805
Implantable Lead Location: 753859
Implantable Lead Location: 753860
Implantable Lead Model: 5076
Implantable Lead Model: 5076
Implantable Pulse Generator Implant Date: 20140627
Lead Channel Impedance Value: 446 Ohm
Lead Channel Impedance Value: 637 Ohm
Lead Channel Pacing Threshold Amplitude: 0.75 V
Lead Channel Pacing Threshold Amplitude: 0.75 V
Lead Channel Pacing Threshold Pulse Width: 0.4 ms
Lead Channel Pacing Threshold Pulse Width: 0.4 ms
Lead Channel Setting Pacing Amplitude: 2 V
Lead Channel Setting Pacing Amplitude: 2.5 V
Lead Channel Setting Pacing Pulse Width: 0.4 ms
Lead Channel Setting Sensing Sensitivity: 2 mV

## 2019-12-27 NOTE — Telephone Encounter (Signed)
Left message for patient to remind of missed remote transmission.  

## 2020-01-01 NOTE — Progress Notes (Signed)
Remote pacemaker transmission.   

## 2020-01-22 DIAGNOSIS — I129 Hypertensive chronic kidney disease with stage 1 through stage 4 chronic kidney disease, or unspecified chronic kidney disease: Secondary | ICD-10-CM | POA: Diagnosis not present

## 2020-01-22 DIAGNOSIS — I482 Chronic atrial fibrillation, unspecified: Secondary | ICD-10-CM | POA: Diagnosis not present

## 2020-01-22 DIAGNOSIS — N183 Chronic kidney disease, stage 3 unspecified: Secondary | ICD-10-CM | POA: Diagnosis not present

## 2020-01-22 DIAGNOSIS — E7849 Other hyperlipidemia: Secondary | ICD-10-CM | POA: Diagnosis not present

## 2020-02-25 DIAGNOSIS — I10 Essential (primary) hypertension: Secondary | ICD-10-CM | POA: Diagnosis not present

## 2020-02-25 DIAGNOSIS — I482 Chronic atrial fibrillation, unspecified: Secondary | ICD-10-CM | POA: Diagnosis not present

## 2020-02-25 DIAGNOSIS — R7301 Impaired fasting glucose: Secondary | ICD-10-CM | POA: Diagnosis not present

## 2020-02-25 DIAGNOSIS — N183 Chronic kidney disease, stage 3 unspecified: Secondary | ICD-10-CM | POA: Diagnosis not present

## 2020-02-25 DIAGNOSIS — E782 Mixed hyperlipidemia: Secondary | ICD-10-CM | POA: Diagnosis not present

## 2020-02-25 DIAGNOSIS — E7801 Familial hypercholesterolemia: Secondary | ICD-10-CM | POA: Diagnosis not present

## 2020-02-25 DIAGNOSIS — E7800 Pure hypercholesterolemia, unspecified: Secondary | ICD-10-CM | POA: Diagnosis not present

## 2020-03-03 DIAGNOSIS — Z6829 Body mass index (BMI) 29.0-29.9, adult: Secondary | ICD-10-CM | POA: Diagnosis not present

## 2020-03-03 DIAGNOSIS — E782 Mixed hyperlipidemia: Secondary | ICD-10-CM | POA: Diagnosis not present

## 2020-03-03 DIAGNOSIS — I10 Essential (primary) hypertension: Secondary | ICD-10-CM | POA: Diagnosis not present

## 2020-03-03 DIAGNOSIS — R7301 Impaired fasting glucose: Secondary | ICD-10-CM | POA: Diagnosis not present

## 2020-03-16 DIAGNOSIS — C44329 Squamous cell carcinoma of skin of other parts of face: Secondary | ICD-10-CM | POA: Diagnosis not present

## 2020-03-16 DIAGNOSIS — L570 Actinic keratosis: Secondary | ICD-10-CM | POA: Diagnosis not present

## 2020-03-24 DIAGNOSIS — N183 Chronic kidney disease, stage 3 unspecified: Secondary | ICD-10-CM | POA: Diagnosis not present

## 2020-03-24 DIAGNOSIS — I129 Hypertensive chronic kidney disease with stage 1 through stage 4 chronic kidney disease, or unspecified chronic kidney disease: Secondary | ICD-10-CM | POA: Diagnosis not present

## 2020-03-24 DIAGNOSIS — I482 Chronic atrial fibrillation, unspecified: Secondary | ICD-10-CM | POA: Diagnosis not present

## 2020-03-24 DIAGNOSIS — E7849 Other hyperlipidemia: Secondary | ICD-10-CM | POA: Diagnosis not present

## 2020-03-26 DIAGNOSIS — C44329 Squamous cell carcinoma of skin of other parts of face: Secondary | ICD-10-CM | POA: Diagnosis not present

## 2020-03-27 ENCOUNTER — Ambulatory Visit (INDEPENDENT_AMBULATORY_CARE_PROVIDER_SITE_OTHER): Payer: Medicare HMO | Admitting: *Deleted

## 2020-03-27 DIAGNOSIS — I442 Atrioventricular block, complete: Secondary | ICD-10-CM | POA: Diagnosis not present

## 2020-03-29 LAB — CUP PACEART REMOTE DEVICE CHECK
Battery Impedance: 694 Ohm
Battery Remaining Longevity: 71 mo
Battery Voltage: 2.77 V
Brady Statistic AP VP Percent: 57 %
Brady Statistic AP VS Percent: 4 %
Brady Statistic AS VP Percent: 36 %
Brady Statistic AS VS Percent: 3 %
Date Time Interrogation Session: 20210903121636
Implantable Lead Implant Date: 20050805
Implantable Lead Implant Date: 20050805
Implantable Lead Location: 753859
Implantable Lead Location: 753860
Implantable Lead Model: 5076
Implantable Lead Model: 5076
Implantable Pulse Generator Implant Date: 20140627
Lead Channel Impedance Value: 440 Ohm
Lead Channel Impedance Value: 651 Ohm
Lead Channel Pacing Threshold Amplitude: 0.625 V
Lead Channel Pacing Threshold Amplitude: 0.875 V
Lead Channel Pacing Threshold Pulse Width: 0.4 ms
Lead Channel Pacing Threshold Pulse Width: 0.4 ms
Lead Channel Setting Pacing Amplitude: 2 V
Lead Channel Setting Pacing Amplitude: 2.5 V
Lead Channel Setting Pacing Pulse Width: 0.4 ms
Lead Channel Setting Sensing Sensitivity: 2 mV

## 2020-04-01 NOTE — Progress Notes (Signed)
Remote pacemaker transmission.   

## 2020-04-03 DIAGNOSIS — R69 Illness, unspecified: Secondary | ICD-10-CM | POA: Diagnosis not present

## 2020-04-23 DIAGNOSIS — I129 Hypertensive chronic kidney disease with stage 1 through stage 4 chronic kidney disease, or unspecified chronic kidney disease: Secondary | ICD-10-CM | POA: Diagnosis not present

## 2020-04-23 DIAGNOSIS — E7849 Other hyperlipidemia: Secondary | ICD-10-CM | POA: Diagnosis not present

## 2020-04-23 DIAGNOSIS — N183 Chronic kidney disease, stage 3 unspecified: Secondary | ICD-10-CM | POA: Diagnosis not present

## 2020-04-23 DIAGNOSIS — I482 Chronic atrial fibrillation, unspecified: Secondary | ICD-10-CM | POA: Diagnosis not present

## 2020-04-27 ENCOUNTER — Other Ambulatory Visit: Payer: Self-pay | Admitting: Internal Medicine

## 2020-04-27 NOTE — Telephone Encounter (Signed)
Prescription refill request for Eliquis received.  Last office visit: Telemedicine, Allred, 08/02/2019 Scr: 1.07, 02/25/2020 Age: 85 y.o. Weight: 71.7 kg   Prescription refill sent.

## 2020-05-06 DIAGNOSIS — R69 Illness, unspecified: Secondary | ICD-10-CM | POA: Diagnosis not present

## 2020-06-23 DIAGNOSIS — I129 Hypertensive chronic kidney disease with stage 1 through stage 4 chronic kidney disease, or unspecified chronic kidney disease: Secondary | ICD-10-CM | POA: Diagnosis not present

## 2020-06-23 DIAGNOSIS — N183 Chronic kidney disease, stage 3 unspecified: Secondary | ICD-10-CM | POA: Diagnosis not present

## 2020-06-23 DIAGNOSIS — E7849 Other hyperlipidemia: Secondary | ICD-10-CM | POA: Diagnosis not present

## 2020-06-26 ENCOUNTER — Ambulatory Visit (INDEPENDENT_AMBULATORY_CARE_PROVIDER_SITE_OTHER): Payer: Medicare HMO

## 2020-06-26 DIAGNOSIS — I442 Atrioventricular block, complete: Secondary | ICD-10-CM | POA: Diagnosis not present

## 2020-07-01 LAB — CUP PACEART REMOTE DEVICE CHECK
Battery Impedance: 769 Ohm
Battery Remaining Longevity: 67 mo
Battery Voltage: 2.76 V
Brady Statistic AP VP Percent: 57 %
Brady Statistic AP VS Percent: 4 %
Brady Statistic AS VP Percent: 36 %
Brady Statistic AS VS Percent: 3 %
Date Time Interrogation Session: 20211207133202
Implantable Lead Implant Date: 20050805
Implantable Lead Implant Date: 20050805
Implantable Lead Location: 753859
Implantable Lead Location: 753860
Implantable Lead Model: 5076
Implantable Lead Model: 5076
Implantable Pulse Generator Implant Date: 20140627
Lead Channel Impedance Value: 453 Ohm
Lead Channel Impedance Value: 616 Ohm
Lead Channel Pacing Threshold Amplitude: 0.5 V
Lead Channel Pacing Threshold Amplitude: 0.75 V
Lead Channel Pacing Threshold Pulse Width: 0.4 ms
Lead Channel Pacing Threshold Pulse Width: 0.4 ms
Lead Channel Setting Pacing Amplitude: 2 V
Lead Channel Setting Pacing Amplitude: 2.5 V
Lead Channel Setting Pacing Pulse Width: 0.4 ms
Lead Channel Setting Sensing Sensitivity: 2 mV

## 2020-07-08 NOTE — Progress Notes (Signed)
Remote pacemaker transmission.   

## 2020-07-24 DIAGNOSIS — I482 Chronic atrial fibrillation, unspecified: Secondary | ICD-10-CM | POA: Diagnosis not present

## 2020-07-24 DIAGNOSIS — E7849 Other hyperlipidemia: Secondary | ICD-10-CM | POA: Diagnosis not present

## 2020-07-24 DIAGNOSIS — I129 Hypertensive chronic kidney disease with stage 1 through stage 4 chronic kidney disease, or unspecified chronic kidney disease: Secondary | ICD-10-CM | POA: Diagnosis not present

## 2020-07-24 DIAGNOSIS — N183 Chronic kidney disease, stage 3 unspecified: Secondary | ICD-10-CM | POA: Diagnosis not present

## 2020-07-29 DIAGNOSIS — E785 Hyperlipidemia, unspecified: Secondary | ICD-10-CM | POA: Diagnosis not present

## 2020-07-29 DIAGNOSIS — Z950 Presence of cardiac pacemaker: Secondary | ICD-10-CM | POA: Diagnosis not present

## 2020-07-29 DIAGNOSIS — I110 Hypertensive heart disease with heart failure: Secondary | ICD-10-CM | POA: Diagnosis not present

## 2020-07-29 DIAGNOSIS — I509 Heart failure, unspecified: Secondary | ICD-10-CM | POA: Diagnosis not present

## 2020-07-29 DIAGNOSIS — Z7901 Long term (current) use of anticoagulants: Secondary | ICD-10-CM | POA: Diagnosis not present

## 2020-07-29 DIAGNOSIS — Z008 Encounter for other general examination: Secondary | ICD-10-CM | POA: Diagnosis not present

## 2020-07-29 DIAGNOSIS — D6869 Other thrombophilia: Secondary | ICD-10-CM | POA: Diagnosis not present

## 2020-07-29 DIAGNOSIS — Z803 Family history of malignant neoplasm of breast: Secondary | ICD-10-CM | POA: Diagnosis not present

## 2020-07-29 DIAGNOSIS — I4891 Unspecified atrial fibrillation: Secondary | ICD-10-CM | POA: Diagnosis not present

## 2020-07-29 DIAGNOSIS — Z85828 Personal history of other malignant neoplasm of skin: Secondary | ICD-10-CM | POA: Diagnosis not present

## 2020-07-31 ENCOUNTER — Ambulatory Visit (INDEPENDENT_AMBULATORY_CARE_PROVIDER_SITE_OTHER): Payer: Medicare HMO | Admitting: Internal Medicine

## 2020-07-31 ENCOUNTER — Encounter: Payer: Self-pay | Admitting: Internal Medicine

## 2020-07-31 VITALS — BP 138/78 | HR 84 | Ht 63.0 in | Wt 159.0 lb

## 2020-07-31 DIAGNOSIS — D6869 Other thrombophilia: Secondary | ICD-10-CM

## 2020-07-31 DIAGNOSIS — I10 Essential (primary) hypertension: Secondary | ICD-10-CM

## 2020-07-31 DIAGNOSIS — I442 Atrioventricular block, complete: Secondary | ICD-10-CM | POA: Diagnosis not present

## 2020-07-31 DIAGNOSIS — I480 Paroxysmal atrial fibrillation: Secondary | ICD-10-CM

## 2020-07-31 NOTE — Addendum Note (Signed)
Addended by: Merlene Laughter on: 07/31/2020 02:15 PM   Modules accepted: Orders

## 2020-07-31 NOTE — Patient Instructions (Addendum)
Medication Instructions:   Your physician recommends that you continue on your current medications as directed. Please refer to the Current Medication list given to you today.  Labwork:  None  Testing/Procedures:  None  Follow-Up:  Your physician recommends that you schedule a follow-up appointment in: 1 year.  Any Other Special Instructions Will Be Listed Below (If Applicable).  If you need a refill on your cardiac medications before your next appointment, please call your pharmacy. 

## 2020-07-31 NOTE — Progress Notes (Signed)
PCP: Manon Hilding, MD   Primary EP:  Dr Rayann Heman  Cassandra Young is a 86 y.o. female who presents today for routine electrophysiology followup.  Since last being seen in our clinic, the patient reports doing very well.  Today, she denies symptoms of palpitations, chest pain, shortness of breath,  lower extremity edema, dizziness, presyncope, or syncope.  The patient is otherwise without complaint today.   Past Medical History:  Diagnosis Date  . Atrioventricular block, complete [facility])    s/p PPM implant originally in 2005 with generator change 2014 (MDT ADDR1 by Dr Rayann Heman)  . Esophageal reflux   . Hypertension   . Pacemaker   . Paroxysmal atrial fibrillation [facility])    Past Surgical History:  Procedure Laterality Date  . CATARACT EXTRACTION W/PHACO Right 05/06/2013   Procedure: CATARACT EXTRACTION PHACO AND INTRAOCULAR LENS PLACEMENT (IOC) CDE=11.18;  Surgeon: Tonny Branch, MD;  Location: [facility];  Service: Ophthalmology;  Laterality: Right;  . CATARACT EXTRACTION W/PHACO Left 05/30/2013   Procedure: CATARACT EXTRACTION PHACO AND INTRAOCULAR LENS PLACEMENT (IOC);  Surgeon: Tonny Branch, MD;  Location: [facility];  Service: Ophthalmology;  Laterality: Left;  CDE:13.93  . PACEMAKER GENERATOR CHANGE  01/18/2013   MDT ADDR1 implanted by Dr Rayann Heman  . PACEMAKER INSERTION  2005   Medtronic Kappa [id number]  . PERMANENT PACEMAKER GENERATOR CHANGE Left 01/18/2013   Procedure: PERMANENT PACEMAKER GENERATOR CHANGE;  Surgeon: Thompson Grayer, MD;  Location: [facility] CATH LAB;  Service: Cardiovascular;  Laterality: Left;    ROS- all systems are reviewed and negative except as per HPI above  Current Outpatient Medications  Medication Sig Dispense Refill  . Calcium Carbonate-Vit D-Min 600-400 MG-UNIT TABS Take 2 tablets by mouth daily.    Marland Kitchen ELIQUIS 5 MG TABS tablet TAKE 1 TABLET BY MOUTH TWICE A DAY 60 tablet 5  . fish oil-omega-3 fatty acids 1000 MG capsule Take 1 g by mouth 2 (two) times daily.    Marland Kitchen  glucosamine-chondroitin 500-400 MG tablet Take 1 tablet by mouth daily.    . metoprolol tartrate (LOPRESSOR) 25 MG tablet Take 25 mg by mouth 2 (two) times daily.    . rosuvastatin (CRESTOR) 5 MG tablet Take 5 mg by mouth every other day.     No current facility-administered medications for this visit.    Physical Exam: Vitals:   07/31/20 1202  BP: 138/78  Pulse: 84  SpO2: 98%  Weight: 159 lb (72.1 kg)  Height: 5\' 3"  (1.6 m)    GEN- The patient is well appearing, alert and oriented x 3 today.   Head- normocephalic, atraumatic Eyes-  Sclera clear, conjunctiva pink Ears- hearing intact Oropharynx- clear Lungs- Clear to ausculation bilaterally, normal work of breathing Chest- pacemaker pocket is well healed Heart- Regular rate and rhythm, no murmurs, rubs or gallops, PMI not laterally displaced GI- soft, NT, ND, + BS Extremities- no clubbing, cyanosis, or edema  Pacemaker interrogation- reviewed in detail today,  See PACEART report  ekg tracing ordered today is personally reviewed and shows AV paced rhythm  Assessment and Plan:  1. Symptomatic sinus bradycardia and transient complete heart block Normal pacemaker function See Pace Art report No changes today she is not device dependant today  2. HTN Stable No change required today  3. Paroxysmal atrial fibrillation afib burden is <1 % (previously 1.9%) chads2vasc score is 4.  She is on eliquis  Risks, benefits and potential toxicities for medications prescribed and/or refilled reviewed with patient today.  Return in a year  Thompson Grayer MD, [facility] 07/31/2020 12:41 PM

## 2020-08-22 DIAGNOSIS — I482 Chronic atrial fibrillation, unspecified: Secondary | ICD-10-CM | POA: Diagnosis not present

## 2020-08-22 DIAGNOSIS — E7849 Other hyperlipidemia: Secondary | ICD-10-CM | POA: Diagnosis not present

## 2020-08-22 DIAGNOSIS — I129 Hypertensive chronic kidney disease with stage 1 through stage 4 chronic kidney disease, or unspecified chronic kidney disease: Secondary | ICD-10-CM | POA: Diagnosis not present

## 2020-08-22 DIAGNOSIS — N183 Chronic kidney disease, stage 3 unspecified: Secondary | ICD-10-CM | POA: Diagnosis not present

## 2020-08-27 DIAGNOSIS — H26491 Other secondary cataract, right eye: Secondary | ICD-10-CM | POA: Diagnosis not present

## 2020-08-27 DIAGNOSIS — Z961 Presence of intraocular lens: Secondary | ICD-10-CM | POA: Diagnosis not present

## 2020-08-27 DIAGNOSIS — H02831 Dermatochalasis of right upper eyelid: Secondary | ICD-10-CM | POA: Diagnosis not present

## 2020-08-27 DIAGNOSIS — H524 Presbyopia: Secondary | ICD-10-CM | POA: Diagnosis not present

## 2020-08-27 DIAGNOSIS — H02834 Dermatochalasis of left upper eyelid: Secondary | ICD-10-CM | POA: Diagnosis not present

## 2020-08-27 DIAGNOSIS — H43813 Vitreous degeneration, bilateral: Secondary | ICD-10-CM | POA: Diagnosis not present

## 2020-08-28 DIAGNOSIS — I10 Essential (primary) hypertension: Secondary | ICD-10-CM | POA: Diagnosis not present

## 2020-08-28 DIAGNOSIS — E782 Mixed hyperlipidemia: Secondary | ICD-10-CM | POA: Diagnosis not present

## 2020-08-28 DIAGNOSIS — N183 Chronic kidney disease, stage 3 unspecified: Secondary | ICD-10-CM | POA: Diagnosis not present

## 2020-08-28 DIAGNOSIS — E7849 Other hyperlipidemia: Secondary | ICD-10-CM | POA: Diagnosis not present

## 2020-08-28 DIAGNOSIS — R7301 Impaired fasting glucose: Secondary | ICD-10-CM | POA: Diagnosis not present

## 2020-08-28 DIAGNOSIS — E7801 Familial hypercholesterolemia: Secondary | ICD-10-CM | POA: Diagnosis not present

## 2020-08-28 DIAGNOSIS — E7800 Pure hypercholesterolemia, unspecified: Secondary | ICD-10-CM | POA: Diagnosis not present

## 2020-09-08 DIAGNOSIS — R7301 Impaired fasting glucose: Secondary | ICD-10-CM | POA: Diagnosis not present

## 2020-09-08 DIAGNOSIS — I10 Essential (primary) hypertension: Secondary | ICD-10-CM | POA: Diagnosis not present

## 2020-09-08 DIAGNOSIS — E7849 Other hyperlipidemia: Secondary | ICD-10-CM | POA: Diagnosis not present

## 2020-09-08 DIAGNOSIS — N289 Disorder of kidney and ureter, unspecified: Secondary | ICD-10-CM | POA: Diagnosis not present

## 2020-09-08 DIAGNOSIS — I482 Chronic atrial fibrillation, unspecified: Secondary | ICD-10-CM | POA: Diagnosis not present

## 2020-09-08 DIAGNOSIS — Z6829 Body mass index (BMI) 29.0-29.9, adult: Secondary | ICD-10-CM | POA: Diagnosis not present

## 2020-09-15 DIAGNOSIS — L570 Actinic keratosis: Secondary | ICD-10-CM | POA: Diagnosis not present

## 2020-09-15 DIAGNOSIS — D0439 Carcinoma in situ of skin of other parts of face: Secondary | ICD-10-CM | POA: Diagnosis not present

## 2020-09-15 DIAGNOSIS — Z85828 Personal history of other malignant neoplasm of skin: Secondary | ICD-10-CM | POA: Diagnosis not present

## 2020-09-15 DIAGNOSIS — D239 Other benign neoplasm of skin, unspecified: Secondary | ICD-10-CM | POA: Diagnosis not present

## 2020-09-15 DIAGNOSIS — D485 Neoplasm of uncertain behavior of skin: Secondary | ICD-10-CM | POA: Diagnosis not present

## 2020-09-21 DIAGNOSIS — E7849 Other hyperlipidemia: Secondary | ICD-10-CM | POA: Diagnosis not present

## 2020-09-21 DIAGNOSIS — I482 Chronic atrial fibrillation, unspecified: Secondary | ICD-10-CM | POA: Diagnosis not present

## 2020-09-21 DIAGNOSIS — I129 Hypertensive chronic kidney disease with stage 1 through stage 4 chronic kidney disease, or unspecified chronic kidney disease: Secondary | ICD-10-CM | POA: Diagnosis not present

## 2020-09-21 DIAGNOSIS — N183 Chronic kidney disease, stage 3 unspecified: Secondary | ICD-10-CM | POA: Diagnosis not present

## 2020-09-25 ENCOUNTER — Ambulatory Visit (INDEPENDENT_AMBULATORY_CARE_PROVIDER_SITE_OTHER): Payer: Medicare HMO

## 2020-09-25 DIAGNOSIS — I480 Paroxysmal atrial fibrillation: Secondary | ICD-10-CM

## 2020-09-28 LAB — CUP PACEART REMOTE DEVICE CHECK
Battery Impedance: 872 Ohm
Battery Remaining Longevity: 61 mo
Battery Voltage: 2.76 V
Brady Statistic AP VP Percent: 47 %
Brady Statistic AP VS Percent: 0 %
Brady Statistic AS VP Percent: 53 %
Brady Statistic AS VS Percent: 0 %
Date Time Interrogation Session: 20220304135316
Implantable Lead Implant Date: 20050805
Implantable Lead Implant Date: 20050805
Implantable Lead Location: 753859
Implantable Lead Location: 753860
Implantable Lead Model: 5076
Implantable Lead Model: 5076
Implantable Pulse Generator Implant Date: 20140627
Lead Channel Impedance Value: 453 Ohm
Lead Channel Impedance Value: 532 Ohm
Lead Channel Pacing Threshold Amplitude: 0.625 V
Lead Channel Pacing Threshold Amplitude: 0.625 V
Lead Channel Pacing Threshold Pulse Width: 0.4 ms
Lead Channel Pacing Threshold Pulse Width: 0.4 ms
Lead Channel Setting Pacing Amplitude: 2 V
Lead Channel Setting Pacing Amplitude: 2.5 V
Lead Channel Setting Pacing Pulse Width: 0.4 ms
Lead Channel Setting Sensing Sensitivity: 2 mV

## 2020-10-01 DIAGNOSIS — C44329 Squamous cell carcinoma of skin of other parts of face: Secondary | ICD-10-CM | POA: Diagnosis not present

## 2020-10-06 NOTE — Patient Instructions (Signed)
0

## 2020-10-06 NOTE — Progress Notes (Signed)
Remote pacemaker transmission.   

## 2020-10-12 DIAGNOSIS — H01004 Unspecified blepharitis left upper eyelid: Secondary | ICD-10-CM | POA: Diagnosis not present

## 2020-10-12 DIAGNOSIS — H01002 Unspecified blepharitis right lower eyelid: Secondary | ICD-10-CM | POA: Diagnosis not present

## 2020-10-12 DIAGNOSIS — H26491 Other secondary cataract, right eye: Secondary | ICD-10-CM | POA: Diagnosis not present

## 2020-10-12 DIAGNOSIS — H26493 Other secondary cataract, bilateral: Secondary | ICD-10-CM | POA: Diagnosis not present

## 2020-10-12 DIAGNOSIS — H01001 Unspecified blepharitis right upper eyelid: Secondary | ICD-10-CM | POA: Diagnosis not present

## 2020-10-21 DIAGNOSIS — E7849 Other hyperlipidemia: Secondary | ICD-10-CM | POA: Diagnosis not present

## 2020-10-21 DIAGNOSIS — I129 Hypertensive chronic kidney disease with stage 1 through stage 4 chronic kidney disease, or unspecified chronic kidney disease: Secondary | ICD-10-CM | POA: Diagnosis not present

## 2020-10-21 DIAGNOSIS — I482 Chronic atrial fibrillation, unspecified: Secondary | ICD-10-CM | POA: Diagnosis not present

## 2020-10-21 DIAGNOSIS — N183 Chronic kidney disease, stage 3 unspecified: Secondary | ICD-10-CM | POA: Diagnosis not present

## 2020-11-21 DIAGNOSIS — E7849 Other hyperlipidemia: Secondary | ICD-10-CM | POA: Diagnosis not present

## 2020-11-21 DIAGNOSIS — I482 Chronic atrial fibrillation, unspecified: Secondary | ICD-10-CM | POA: Diagnosis not present

## 2020-11-21 DIAGNOSIS — I129 Hypertensive chronic kidney disease with stage 1 through stage 4 chronic kidney disease, or unspecified chronic kidney disease: Secondary | ICD-10-CM | POA: Diagnosis not present

## 2020-11-21 DIAGNOSIS — N183 Chronic kidney disease, stage 3 unspecified: Secondary | ICD-10-CM | POA: Diagnosis not present

## 2020-12-21 DIAGNOSIS — E7849 Other hyperlipidemia: Secondary | ICD-10-CM | POA: Diagnosis not present

## 2020-12-21 DIAGNOSIS — N183 Chronic kidney disease, stage 3 unspecified: Secondary | ICD-10-CM | POA: Diagnosis not present

## 2020-12-21 DIAGNOSIS — I129 Hypertensive chronic kidney disease with stage 1 through stage 4 chronic kidney disease, or unspecified chronic kidney disease: Secondary | ICD-10-CM | POA: Diagnosis not present

## 2020-12-21 DIAGNOSIS — I482 Chronic atrial fibrillation, unspecified: Secondary | ICD-10-CM | POA: Diagnosis not present

## 2020-12-25 ENCOUNTER — Ambulatory Visit (INDEPENDENT_AMBULATORY_CARE_PROVIDER_SITE_OTHER): Payer: Medicare HMO

## 2020-12-25 DIAGNOSIS — I480 Paroxysmal atrial fibrillation: Secondary | ICD-10-CM | POA: Diagnosis not present

## 2020-12-26 LAB — CUP PACEART REMOTE DEVICE CHECK
Battery Impedance: 976 Ohm
Battery Remaining Longevity: 56 mo
Battery Voltage: 2.76 V
Brady Statistic AP VP Percent: 54 %
Brady Statistic AP VS Percent: 0 %
Brady Statistic AS VP Percent: 46 %
Brady Statistic AS VS Percent: 0 %
Date Time Interrogation Session: 20220603143532
Implantable Lead Implant Date: 20050805
Implantable Lead Implant Date: 20050805
Implantable Lead Location: 753859
Implantable Lead Location: 753860
Implantable Lead Model: 5076
Implantable Lead Model: 5076
Implantable Pulse Generator Implant Date: 20140627
Lead Channel Impedance Value: 446 Ohm
Lead Channel Impedance Value: 499 Ohm
Lead Channel Pacing Threshold Amplitude: 0.625 V
Lead Channel Pacing Threshold Amplitude: 0.75 V
Lead Channel Pacing Threshold Pulse Width: 0.4 ms
Lead Channel Pacing Threshold Pulse Width: 0.4 ms
Lead Channel Setting Pacing Amplitude: 2 V
Lead Channel Setting Pacing Amplitude: 2.5 V
Lead Channel Setting Pacing Pulse Width: 0.4 ms
Lead Channel Setting Sensing Sensitivity: 2 mV

## 2020-12-30 ENCOUNTER — Other Ambulatory Visit: Payer: Self-pay | Admitting: Internal Medicine

## 2020-12-30 NOTE — Telephone Encounter (Signed)
Pt last saw Dr Rayann Heman 07/31/20, last labs 08/28/20 Creat 0.77 at [place], age 86, weight 72.1kg, based on specified criteria pt is on appropriate dosage of Eliquis 5mg  BID.  Will refill rx.

## 2021-01-12 NOTE — Progress Notes (Signed)
Remote pacemaker transmission.   

## 2021-02-21 DIAGNOSIS — I129 Hypertensive chronic kidney disease with stage 1 through stage 4 chronic kidney disease, or unspecified chronic kidney disease: Secondary | ICD-10-CM | POA: Diagnosis not present

## 2021-02-21 DIAGNOSIS — E7849 Other hyperlipidemia: Secondary | ICD-10-CM | POA: Diagnosis not present

## 2021-02-21 DIAGNOSIS — I482 Chronic atrial fibrillation, unspecified: Secondary | ICD-10-CM | POA: Diagnosis not present

## 2021-02-21 DIAGNOSIS — N183 Chronic kidney disease, stage 3 unspecified: Secondary | ICD-10-CM | POA: Diagnosis not present

## 2021-03-01 ENCOUNTER — Encounter: Payer: Self-pay | Admitting: Internal Medicine

## 2021-03-01 DIAGNOSIS — N183 Chronic kidney disease, stage 3 unspecified: Secondary | ICD-10-CM | POA: Diagnosis not present

## 2021-03-01 DIAGNOSIS — R7301 Impaired fasting glucose: Secondary | ICD-10-CM | POA: Diagnosis not present

## 2021-03-01 DIAGNOSIS — E7801 Familial hypercholesterolemia: Secondary | ICD-10-CM | POA: Diagnosis not present

## 2021-03-01 DIAGNOSIS — E782 Mixed hyperlipidemia: Secondary | ICD-10-CM | POA: Diagnosis not present

## 2021-03-01 DIAGNOSIS — E7849 Other hyperlipidemia: Secondary | ICD-10-CM | POA: Diagnosis not present

## 2021-03-01 DIAGNOSIS — I442 Atrioventricular block, complete: Secondary | ICD-10-CM | POA: Diagnosis not present

## 2021-03-01 DIAGNOSIS — Z6829 Body mass index (BMI) 29.0-29.9, adult: Secondary | ICD-10-CM | POA: Diagnosis not present

## 2021-03-01 DIAGNOSIS — Z0001 Encounter for general adult medical examination with abnormal findings: Secondary | ICD-10-CM | POA: Diagnosis not present

## 2021-03-01 DIAGNOSIS — I10 Essential (primary) hypertension: Secondary | ICD-10-CM | POA: Diagnosis not present

## 2021-03-01 DIAGNOSIS — I482 Chronic atrial fibrillation, unspecified: Secondary | ICD-10-CM | POA: Diagnosis not present

## 2021-03-04 DIAGNOSIS — I10 Essential (primary) hypertension: Secondary | ICD-10-CM | POA: Diagnosis not present

## 2021-03-04 DIAGNOSIS — E7849 Other hyperlipidemia: Secondary | ICD-10-CM | POA: Diagnosis not present

## 2021-03-04 DIAGNOSIS — Z6829 Body mass index (BMI) 29.0-29.9, adult: Secondary | ICD-10-CM | POA: Diagnosis not present

## 2021-03-04 DIAGNOSIS — R7301 Impaired fasting glucose: Secondary | ICD-10-CM | POA: Diagnosis not present

## 2021-03-16 DIAGNOSIS — D485 Neoplasm of uncertain behavior of skin: Secondary | ICD-10-CM | POA: Diagnosis not present

## 2021-03-16 DIAGNOSIS — L570 Actinic keratosis: Secondary | ICD-10-CM | POA: Diagnosis not present

## 2021-03-24 DIAGNOSIS — I129 Hypertensive chronic kidney disease with stage 1 through stage 4 chronic kidney disease, or unspecified chronic kidney disease: Secondary | ICD-10-CM | POA: Diagnosis not present

## 2021-03-24 DIAGNOSIS — E7849 Other hyperlipidemia: Secondary | ICD-10-CM | POA: Diagnosis not present

## 2021-03-24 DIAGNOSIS — N183 Chronic kidney disease, stage 3 unspecified: Secondary | ICD-10-CM | POA: Diagnosis not present

## 2021-03-24 DIAGNOSIS — I482 Chronic atrial fibrillation, unspecified: Secondary | ICD-10-CM | POA: Diagnosis not present

## 2021-03-26 ENCOUNTER — Ambulatory Visit (INDEPENDENT_AMBULATORY_CARE_PROVIDER_SITE_OTHER): Payer: Medicare HMO

## 2021-03-26 DIAGNOSIS — I472 Ventricular tachycardia, unspecified: Secondary | ICD-10-CM

## 2021-03-26 DIAGNOSIS — I4729 Other ventricular tachycardia: Secondary | ICD-10-CM

## 2021-03-30 LAB — CUP PACEART REMOTE DEVICE CHECK
Battery Impedance: 1028 Ohm
Battery Remaining Longevity: 53 mo
Battery Voltage: 2.76 V
Brady Statistic AP VP Percent: 57 %
Brady Statistic AP VS Percent: 0 %
Brady Statistic AS VP Percent: 43 %
Brady Statistic AS VS Percent: 0 %
Date Time Interrogation Session: 20220902124518
Implantable Lead Implant Date: 20050805
Implantable Lead Implant Date: 20050805
Implantable Lead Location: 753859
Implantable Lead Location: 753860
Implantable Lead Model: 5076
Implantable Lead Model: 5076
Implantable Pulse Generator Implant Date: 20140627
Lead Channel Impedance Value: 429 Ohm
Lead Channel Impedance Value: 468 Ohm
Lead Channel Pacing Threshold Amplitude: 0.625 V
Lead Channel Pacing Threshold Amplitude: 0.75 V
Lead Channel Pacing Threshold Pulse Width: 0.4 ms
Lead Channel Pacing Threshold Pulse Width: 0.4 ms
Lead Channel Setting Pacing Amplitude: 2 V
Lead Channel Setting Pacing Amplitude: 2.5 V
Lead Channel Setting Pacing Pulse Width: 0.4 ms
Lead Channel Setting Sensing Sensitivity: 2 mV

## 2021-04-07 NOTE — Progress Notes (Signed)
Remote pacemaker transmission.   

## 2021-05-12 DIAGNOSIS — Z0100 Encounter for examination of eyes and vision without abnormal findings: Secondary | ICD-10-CM | POA: Diagnosis not present

## 2021-05-12 DIAGNOSIS — I10 Essential (primary) hypertension: Secondary | ICD-10-CM | POA: Diagnosis not present

## 2021-05-12 DIAGNOSIS — E7800 Pure hypercholesterolemia, unspecified: Secondary | ICD-10-CM | POA: Diagnosis not present

## 2021-06-25 ENCOUNTER — Ambulatory Visit (INDEPENDENT_AMBULATORY_CARE_PROVIDER_SITE_OTHER): Payer: Medicare HMO

## 2021-06-25 DIAGNOSIS — I4729 Other ventricular tachycardia: Secondary | ICD-10-CM

## 2021-06-25 DIAGNOSIS — I472 Ventricular tachycardia, unspecified: Secondary | ICD-10-CM

## 2021-06-28 LAB — CUP PACEART REMOTE DEVICE CHECK
Battery Impedance: 1186 Ohm
Battery Remaining Longevity: 49 mo
Battery Voltage: 2.76 V
Brady Statistic AP VP Percent: 61 %
Brady Statistic AP VS Percent: 0 %
Brady Statistic AS VP Percent: 39 %
Brady Statistic AS VS Percent: 0 %
Date Time Interrogation Session: 20221203103127
Implantable Lead Implant Date: 20050805
Implantable Lead Implant Date: 20050805
Implantable Lead Location: 753859
Implantable Lead Location: 753860
Implantable Lead Model: 5076
Implantable Lead Model: 5076
Implantable Pulse Generator Implant Date: 20140627
Lead Channel Impedance Value: 465 Ohm
Lead Channel Impedance Value: 510 Ohm
Lead Channel Pacing Threshold Amplitude: 0.625 V
Lead Channel Pacing Threshold Amplitude: 0.75 V
Lead Channel Pacing Threshold Pulse Width: 0.4 ms
Lead Channel Pacing Threshold Pulse Width: 0.4 ms
Lead Channel Setting Pacing Amplitude: 2 V
Lead Channel Setting Pacing Amplitude: 2.5 V
Lead Channel Setting Pacing Pulse Width: 0.4 ms
Lead Channel Setting Sensing Sensitivity: 2 mV

## 2021-07-02 ENCOUNTER — Other Ambulatory Visit: Payer: Self-pay | Admitting: Internal Medicine

## 2021-07-02 NOTE — Telephone Encounter (Signed)
Prescription refill request for Eliquis received. Indication: Afib  Last office visit: 07/31/20 (Allred)  Scr: 1.02 (03/01/21)  Age: 86 Weight: 72.1kg  Appropriate dose and refill sent to requested pharmacy.

## 2021-07-06 NOTE — Progress Notes (Signed)
Remote pacemaker transmission.   

## 2021-07-30 ENCOUNTER — Ambulatory Visit: Payer: Medicare HMO | Admitting: Internal Medicine

## 2021-07-30 ENCOUNTER — Encounter: Payer: Self-pay | Admitting: Internal Medicine

## 2021-07-30 VITALS — BP 158/92 | HR 81 | Ht 63.0 in | Wt 162.0 lb

## 2021-07-30 DIAGNOSIS — I480 Paroxysmal atrial fibrillation: Secondary | ICD-10-CM | POA: Diagnosis not present

## 2021-07-30 DIAGNOSIS — D6869 Other thrombophilia: Secondary | ICD-10-CM

## 2021-07-30 DIAGNOSIS — I495 Sick sinus syndrome: Secondary | ICD-10-CM | POA: Diagnosis not present

## 2021-07-30 DIAGNOSIS — I442 Atrioventricular block, complete: Secondary | ICD-10-CM

## 2021-07-30 DIAGNOSIS — I10 Essential (primary) hypertension: Secondary | ICD-10-CM | POA: Diagnosis not present

## 2021-07-30 NOTE — Progress Notes (Signed)
PCP: Manon Hilding, MD   Primary EP:  Dr Rayann Heman  Cassandra Young is a 87 y.o. female who presents today for routine electrophysiology followup.  Since last being seen in our clinic, the patient reports doing very well.  Today, she denies symptoms of palpitations, chest pain, shortness of breath,  lower extremity edema, dizziness, presyncope, or syncope.  The patient is otherwise without complaint today.   Past Medical History:  Diagnosis Date   Atrioventricular block, complete ([place])    s/p PPM implant originally in 2005 with generator change 2014 (MDT ADDR1 by Dr Rayann Heman)   Esophageal reflux    Hypertension    Pacemaker    Paroxysmal atrial fibrillation [facility])    Past Surgical History:  Procedure Laterality Date   CATARACT EXTRACTION W/PHACO Right 05/06/2013   Procedure: CATARACT EXTRACTION PHACO AND INTRAOCULAR LENS PLACEMENT (IOC) CDE=11.18;  Surgeon: Tonny Branch, MD;  Location: [facility];  Service: Ophthalmology;  Laterality: Right;   CATARACT EXTRACTION W/PHACO Left 05/30/2013   Procedure: CATARACT EXTRACTION PHACO AND INTRAOCULAR LENS PLACEMENT (IOC);  Surgeon: Tonny Branch, MD;  Location: [facility];  Service: Ophthalmology;  Laterality: Left;  CDE:13.93   PACEMAKER GENERATOR CHANGE  01/18/2013   MDT ADDR1 implanted by Dr Rayann Heman   PACEMAKER INSERTION  2005   Medtronic Kappa [id number]   PERMANENT PACEMAKER GENERATOR CHANGE Left 01/18/2013   Procedure: PERMANENT PACEMAKER GENERATOR CHANGE;  Surgeon: Thompson Grayer, MD;  Location: [facility] CATH LAB;  Service: Cardiovascular;  Laterality: Left;    ROS- all systems are reviewed and negative except as per HPI above  Current Outpatient Medications  Medication Sig Dispense Refill   Calcium Carbonate-Vit D-Min 600-400 MG-UNIT TABS Take 2 tablets by mouth daily.     ELIQUIS 5 MG TABS tablet TAKE 1 TABLET BY MOUTH TWICE A DAY 60 tablet 5   fish oil-omega-3 fatty acids 1000 MG capsule Take 1 g by mouth 2 (two) times daily.     glucosamine-chondroitin  500-400 MG tablet Take 1 tablet by mouth daily.     metoprolol tartrate (LOPRESSOR) 25 MG tablet Take 25 mg by mouth 2 (two) times daily.     rosuvastatin (CRESTOR) 5 MG tablet Take 5 mg by mouth every other day.     No current facility-administered medications for this visit.    Physical Exam: Vitals:   07/30/21 1153  BP: (!) 158/92  Pulse: 81  Weight: 162 lb (73.5 kg)  Height: 5\' 3"  (1.6 m)    GEN- The patient is well appearing, alert and oriented x 3 today.   Head- normocephalic, atraumatic Eyes-  Sclera clear, conjunctiva pink Ears- hearing intact Oropharynx- clear Lungs- Clear to ausculation bilaterally, normal work of breathing Chest- pacemaker pocket is well healed Heart- Regular rate and rhythm, no murmurs, rubs or gallops, PMI not laterally displaced GI- soft, NT, ND, + BS Extremities- no clubbing, cyanosis, or edema  Pacemaker interrogation- reviewed in detail today,  See PACEART report    Assessment and Plan:  1. Symptomatic sinus bradycardia and transient complete heart block Normal pacemaker function See Pace Art report No changes today she is not device dependant today  2. HTN Stable No change required today I will request labs from Dr Quintin Alto to review  3. Paroxysmal atrial fibrillation AF burden is 12.8 % (previously >1%) She is unaware Continue eliquis for chads2vasc score of 4 We will request labs as above from Dr Edythe Lynn office  Risks, benefits and potential toxicities for medications prescribed and/or  refilled reviewed with patient today.   Return in a year  Thompson Grayer MD, [facility] 07/30/2021 12:03 PM

## 2021-07-30 NOTE — Patient Instructions (Signed)
Medication Instructions:  Continue all current medications.  Labwork: none  Testing/Procedures: none  Follow-Up: 1 year - Dr.  Allred   Any Other Special Instructions Will Be Listed Below (If Applicable).   If you need a refill on your cardiac medications before your next appointment, please call your pharmacy.  

## 2021-08-05 ENCOUNTER — Encounter: Payer: Self-pay | Admitting: *Deleted

## 2021-08-24 DIAGNOSIS — E782 Mixed hyperlipidemia: Secondary | ICD-10-CM | POA: Diagnosis not present

## 2021-08-24 DIAGNOSIS — I10 Essential (primary) hypertension: Secondary | ICD-10-CM | POA: Diagnosis not present

## 2021-08-24 DIAGNOSIS — R7301 Impaired fasting glucose: Secondary | ICD-10-CM | POA: Diagnosis not present

## 2021-08-24 DIAGNOSIS — N183 Chronic kidney disease, stage 3 unspecified: Secondary | ICD-10-CM | POA: Diagnosis not present

## 2021-08-24 DIAGNOSIS — E7849 Other hyperlipidemia: Secondary | ICD-10-CM | POA: Diagnosis not present

## 2021-08-26 DIAGNOSIS — Z23 Encounter for immunization: Secondary | ICD-10-CM | POA: Diagnosis not present

## 2021-08-26 DIAGNOSIS — Z6829 Body mass index (BMI) 29.0-29.9, adult: Secondary | ICD-10-CM | POA: Diagnosis not present

## 2021-08-26 DIAGNOSIS — I10 Essential (primary) hypertension: Secondary | ICD-10-CM | POA: Diagnosis not present

## 2021-08-26 DIAGNOSIS — N189 Chronic kidney disease, unspecified: Secondary | ICD-10-CM | POA: Diagnosis not present

## 2021-08-26 DIAGNOSIS — E7849 Other hyperlipidemia: Secondary | ICD-10-CM | POA: Diagnosis not present

## 2021-08-26 DIAGNOSIS — I482 Chronic atrial fibrillation, unspecified: Secondary | ICD-10-CM | POA: Diagnosis not present

## 2021-08-26 DIAGNOSIS — R7301 Impaired fasting glucose: Secondary | ICD-10-CM | POA: Diagnosis not present

## 2021-09-08 DIAGNOSIS — Z23 Encounter for immunization: Secondary | ICD-10-CM | POA: Diagnosis not present

## 2021-09-20 DIAGNOSIS — D485 Neoplasm of uncertain behavior of skin: Secondary | ICD-10-CM | POA: Diagnosis not present

## 2021-09-20 DIAGNOSIS — L570 Actinic keratosis: Secondary | ICD-10-CM | POA: Diagnosis not present

## 2021-09-20 DIAGNOSIS — D2239 Melanocytic nevi of other parts of face: Secondary | ICD-10-CM | POA: Diagnosis not present

## 2021-09-24 ENCOUNTER — Ambulatory Visit (INDEPENDENT_AMBULATORY_CARE_PROVIDER_SITE_OTHER): Payer: Medicare HMO

## 2021-09-24 DIAGNOSIS — I442 Atrioventricular block, complete: Secondary | ICD-10-CM | POA: Diagnosis not present

## 2021-09-24 LAB — CUP PACEART REMOTE DEVICE CHECK
Battery Impedance: 1241 Ohm
Battery Remaining Longevity: 48 mo
Battery Voltage: 2.75 V
Brady Statistic AP VP Percent: 54 %
Brady Statistic AP VS Percent: 0 %
Brady Statistic AS VP Percent: 46 %
Brady Statistic AS VS Percent: 0 %
Date Time Interrogation Session: 20230303124200
Implantable Lead Implant Date: 20050805
Implantable Lead Implant Date: 20050805
Implantable Lead Location: 753859
Implantable Lead Location: 753860
Implantable Lead Model: 5076
Implantable Lead Model: 5076
Implantable Pulse Generator Implant Date: 20140627
Lead Channel Impedance Value: 465 Ohm
Lead Channel Impedance Value: 487 Ohm
Lead Channel Pacing Threshold Amplitude: 0.625 V
Lead Channel Pacing Threshold Amplitude: 0.625 V
Lead Channel Pacing Threshold Pulse Width: 0.4 ms
Lead Channel Pacing Threshold Pulse Width: 0.4 ms
Lead Channel Setting Pacing Amplitude: 2 V
Lead Channel Setting Pacing Amplitude: 2.5 V
Lead Channel Setting Pacing Pulse Width: 0.4 ms
Lead Channel Setting Sensing Sensitivity: 2 mV

## 2021-10-04 NOTE — Progress Notes (Signed)
Remote pacemaker transmission.   

## 2021-10-22 DIAGNOSIS — I129 Hypertensive chronic kidney disease with stage 1 through stage 4 chronic kidney disease, or unspecified chronic kidney disease: Secondary | ICD-10-CM | POA: Diagnosis not present

## 2021-10-22 DIAGNOSIS — I482 Chronic atrial fibrillation, unspecified: Secondary | ICD-10-CM | POA: Diagnosis not present

## 2021-10-22 DIAGNOSIS — E7849 Other hyperlipidemia: Secondary | ICD-10-CM | POA: Diagnosis not present

## 2021-11-08 DIAGNOSIS — Z23 Encounter for immunization: Secondary | ICD-10-CM | POA: Diagnosis not present

## 2021-12-22 DIAGNOSIS — I10 Essential (primary) hypertension: Secondary | ICD-10-CM | POA: Diagnosis not present

## 2021-12-22 DIAGNOSIS — E782 Mixed hyperlipidemia: Secondary | ICD-10-CM | POA: Diagnosis not present

## 2021-12-22 DIAGNOSIS — I482 Chronic atrial fibrillation, unspecified: Secondary | ICD-10-CM | POA: Diagnosis not present

## 2021-12-24 ENCOUNTER — Ambulatory Visit (INDEPENDENT_AMBULATORY_CARE_PROVIDER_SITE_OTHER): Payer: Medicare HMO

## 2021-12-24 DIAGNOSIS — I495 Sick sinus syndrome: Secondary | ICD-10-CM

## 2021-12-26 LAB — CUP PACEART REMOTE DEVICE CHECK
Battery Impedance: 1348 Ohm
Battery Remaining Longevity: 46 mo
Battery Voltage: 2.75 V
Brady Statistic AP VP Percent: 55 %
Brady Statistic AP VS Percent: 0 %
Brady Statistic AS VP Percent: 45 %
Brady Statistic AS VS Percent: 0 %
Date Time Interrogation Session: 20230602162152
Implantable Lead Implant Date: 20050805
Implantable Lead Implant Date: 20050805
Implantable Lead Location: 753859
Implantable Lead Location: 753860
Implantable Lead Model: 5076
Implantable Lead Model: 5076
Implantable Pulse Generator Implant Date: 20140627
Lead Channel Impedance Value: 466 Ohm
Lead Channel Impedance Value: 527 Ohm
Lead Channel Pacing Threshold Amplitude: 0.625 V
Lead Channel Pacing Threshold Amplitude: 0.625 V
Lead Channel Pacing Threshold Pulse Width: 0.4 ms
Lead Channel Pacing Threshold Pulse Width: 0.4 ms
Lead Channel Setting Pacing Amplitude: 2 V
Lead Channel Setting Pacing Amplitude: 2.5 V
Lead Channel Setting Pacing Pulse Width: 0.4 ms
Lead Channel Setting Sensing Sensitivity: 2 mV

## 2021-12-31 NOTE — Progress Notes (Signed)
Remote pacemaker transmission.   

## 2022-02-04 ENCOUNTER — Other Ambulatory Visit: Payer: Self-pay | Admitting: Internal Medicine

## 2022-02-04 DIAGNOSIS — I4891 Unspecified atrial fibrillation: Secondary | ICD-10-CM

## 2022-02-04 NOTE — Telephone Encounter (Signed)
Prescription refill request for Eliquis received. Indication:Afib  Last office visit: 07/30/21 (Allred) Scr: 1.02 (02/23/21) Age: 87 Weight: 73.5kg  Appropriate dose and refill sent tp requested pharmacy.

## 2022-03-01 DIAGNOSIS — N183 Chronic kidney disease, stage 3 unspecified: Secondary | ICD-10-CM | POA: Diagnosis not present

## 2022-03-01 DIAGNOSIS — E7849 Other hyperlipidemia: Secondary | ICD-10-CM | POA: Diagnosis not present

## 2022-03-01 DIAGNOSIS — E7801 Familial hypercholesterolemia: Secondary | ICD-10-CM | POA: Diagnosis not present

## 2022-03-01 DIAGNOSIS — R7301 Impaired fasting glucose: Secondary | ICD-10-CM | POA: Diagnosis not present

## 2022-03-04 DIAGNOSIS — Z1331 Encounter for screening for depression: Secondary | ICD-10-CM | POA: Diagnosis not present

## 2022-03-04 DIAGNOSIS — R7301 Impaired fasting glucose: Secondary | ICD-10-CM | POA: Diagnosis not present

## 2022-03-04 DIAGNOSIS — Z6828 Body mass index (BMI) 28.0-28.9, adult: Secondary | ICD-10-CM | POA: Diagnosis not present

## 2022-03-04 DIAGNOSIS — Z1389 Encounter for screening for other disorder: Secondary | ICD-10-CM | POA: Diagnosis not present

## 2022-03-04 DIAGNOSIS — N189 Chronic kidney disease, unspecified: Secondary | ICD-10-CM | POA: Diagnosis not present

## 2022-03-04 DIAGNOSIS — R590 Localized enlarged lymph nodes: Secondary | ICD-10-CM | POA: Diagnosis not present

## 2022-03-04 DIAGNOSIS — I10 Essential (primary) hypertension: Secondary | ICD-10-CM | POA: Diagnosis not present

## 2022-03-04 DIAGNOSIS — E7849 Other hyperlipidemia: Secondary | ICD-10-CM | POA: Diagnosis not present

## 2022-03-04 DIAGNOSIS — I482 Chronic atrial fibrillation, unspecified: Secondary | ICD-10-CM | POA: Diagnosis not present

## 2022-03-08 DIAGNOSIS — R590 Localized enlarged lymph nodes: Secondary | ICD-10-CM | POA: Diagnosis not present

## 2022-03-24 DIAGNOSIS — M5430 Sciatica, unspecified side: Secondary | ICD-10-CM | POA: Diagnosis not present

## 2022-03-24 DIAGNOSIS — Z6828 Body mass index (BMI) 28.0-28.9, adult: Secondary | ICD-10-CM | POA: Diagnosis not present

## 2022-03-24 DIAGNOSIS — R030 Elevated blood-pressure reading, without diagnosis of hypertension: Secondary | ICD-10-CM | POA: Diagnosis not present

## 2022-04-06 DIAGNOSIS — S233XXA Sprain of ligaments of thoracic spine, initial encounter: Secondary | ICD-10-CM | POA: Diagnosis not present

## 2022-04-06 DIAGNOSIS — M9903 Segmental and somatic dysfunction of lumbar region: Secondary | ICD-10-CM | POA: Diagnosis not present

## 2022-04-06 DIAGNOSIS — S338XXA Sprain of other parts of lumbar spine and pelvis, initial encounter: Secondary | ICD-10-CM | POA: Diagnosis not present

## 2022-04-06 DIAGNOSIS — M9902 Segmental and somatic dysfunction of thoracic region: Secondary | ICD-10-CM | POA: Diagnosis not present

## 2022-04-13 DIAGNOSIS — S233XXA Sprain of ligaments of thoracic spine, initial encounter: Secondary | ICD-10-CM | POA: Diagnosis not present

## 2022-04-13 DIAGNOSIS — M9902 Segmental and somatic dysfunction of thoracic region: Secondary | ICD-10-CM | POA: Diagnosis not present

## 2022-04-13 DIAGNOSIS — S338XXA Sprain of other parts of lumbar spine and pelvis, initial encounter: Secondary | ICD-10-CM | POA: Diagnosis not present

## 2022-04-13 DIAGNOSIS — M9903 Segmental and somatic dysfunction of lumbar region: Secondary | ICD-10-CM | POA: Diagnosis not present

## 2022-04-20 DIAGNOSIS — M9902 Segmental and somatic dysfunction of thoracic region: Secondary | ICD-10-CM | POA: Diagnosis not present

## 2022-04-20 DIAGNOSIS — S233XXA Sprain of ligaments of thoracic spine, initial encounter: Secondary | ICD-10-CM | POA: Diagnosis not present

## 2022-04-20 DIAGNOSIS — M9903 Segmental and somatic dysfunction of lumbar region: Secondary | ICD-10-CM | POA: Diagnosis not present

## 2022-04-20 DIAGNOSIS — S338XXA Sprain of other parts of lumbar spine and pelvis, initial encounter: Secondary | ICD-10-CM | POA: Diagnosis not present

## 2022-04-26 ENCOUNTER — Ambulatory Visit (INDEPENDENT_AMBULATORY_CARE_PROVIDER_SITE_OTHER): Payer: Medicare HMO

## 2022-04-26 DIAGNOSIS — I495 Sick sinus syndrome: Secondary | ICD-10-CM

## 2022-04-26 LAB — CUP PACEART REMOTE DEVICE CHECK
Battery Impedance: 1571 Ohm
Battery Remaining Longevity: 40 mo
Battery Voltage: 2.75 V
Brady Statistic AP VP Percent: 56 %
Brady Statistic AP VS Percent: 0 %
Brady Statistic AS VP Percent: 43 %
Brady Statistic AS VS Percent: 0 %
Date Time Interrogation Session: 20230929093547
Implantable Lead Implant Date: 20050805
Implantable Lead Implant Date: 20050805
Implantable Lead Location: 753859
Implantable Lead Location: 753860
Implantable Lead Model: 5076
Implantable Lead Model: 5076
Implantable Pulse Generator Implant Date: 20140627
Lead Channel Impedance Value: 435 Ohm
Lead Channel Impedance Value: 505 Ohm
Lead Channel Pacing Threshold Amplitude: 0.5 V
Lead Channel Pacing Threshold Amplitude: 0.625 V
Lead Channel Pacing Threshold Pulse Width: 0.4 ms
Lead Channel Pacing Threshold Pulse Width: 0.4 ms
Lead Channel Setting Pacing Amplitude: 2 V
Lead Channel Setting Pacing Amplitude: 2.5 V
Lead Channel Setting Pacing Pulse Width: 0.4 ms
Lead Channel Setting Sensing Sensitivity: 2 mV

## 2022-04-27 DIAGNOSIS — M9902 Segmental and somatic dysfunction of thoracic region: Secondary | ICD-10-CM | POA: Diagnosis not present

## 2022-04-27 DIAGNOSIS — S233XXA Sprain of ligaments of thoracic spine, initial encounter: Secondary | ICD-10-CM | POA: Diagnosis not present

## 2022-04-27 DIAGNOSIS — S338XXA Sprain of other parts of lumbar spine and pelvis, initial encounter: Secondary | ICD-10-CM | POA: Diagnosis not present

## 2022-04-27 DIAGNOSIS — M9903 Segmental and somatic dysfunction of lumbar region: Secondary | ICD-10-CM | POA: Diagnosis not present

## 2022-05-04 DIAGNOSIS — S338XXA Sprain of other parts of lumbar spine and pelvis, initial encounter: Secondary | ICD-10-CM | POA: Diagnosis not present

## 2022-05-04 DIAGNOSIS — M9902 Segmental and somatic dysfunction of thoracic region: Secondary | ICD-10-CM | POA: Diagnosis not present

## 2022-05-04 DIAGNOSIS — M9903 Segmental and somatic dysfunction of lumbar region: Secondary | ICD-10-CM | POA: Diagnosis not present

## 2022-05-04 DIAGNOSIS — S233XXA Sprain of ligaments of thoracic spine, initial encounter: Secondary | ICD-10-CM | POA: Diagnosis not present

## 2022-05-09 NOTE — Progress Notes (Signed)
Remote pacemaker transmission.   

## 2022-05-11 DIAGNOSIS — M9903 Segmental and somatic dysfunction of lumbar region: Secondary | ICD-10-CM | POA: Diagnosis not present

## 2022-05-11 DIAGNOSIS — S233XXA Sprain of ligaments of thoracic spine, initial encounter: Secondary | ICD-10-CM | POA: Diagnosis not present

## 2022-05-11 DIAGNOSIS — S338XXA Sprain of other parts of lumbar spine and pelvis, initial encounter: Secondary | ICD-10-CM | POA: Diagnosis not present

## 2022-05-11 DIAGNOSIS — M9902 Segmental and somatic dysfunction of thoracic region: Secondary | ICD-10-CM | POA: Diagnosis not present

## 2022-05-18 DIAGNOSIS — Z85828 Personal history of other malignant neoplasm of skin: Secondary | ICD-10-CM | POA: Diagnosis not present

## 2022-05-18 DIAGNOSIS — L570 Actinic keratosis: Secondary | ICD-10-CM | POA: Diagnosis not present

## 2022-05-18 DIAGNOSIS — D239 Other benign neoplasm of skin, unspecified: Secondary | ICD-10-CM | POA: Diagnosis not present

## 2022-05-18 DIAGNOSIS — Z1283 Encounter for screening for malignant neoplasm of skin: Secondary | ICD-10-CM | POA: Diagnosis not present

## 2022-05-20 DIAGNOSIS — M9903 Segmental and somatic dysfunction of lumbar region: Secondary | ICD-10-CM | POA: Diagnosis not present

## 2022-05-20 DIAGNOSIS — M9902 Segmental and somatic dysfunction of thoracic region: Secondary | ICD-10-CM | POA: Diagnosis not present

## 2022-05-20 DIAGNOSIS — S233XXA Sprain of ligaments of thoracic spine, initial encounter: Secondary | ICD-10-CM | POA: Diagnosis not present

## 2022-05-20 DIAGNOSIS — S338XXA Sprain of other parts of lumbar spine and pelvis, initial encounter: Secondary | ICD-10-CM | POA: Diagnosis not present

## 2022-06-01 DIAGNOSIS — M9903 Segmental and somatic dysfunction of lumbar region: Secondary | ICD-10-CM | POA: Diagnosis not present

## 2022-06-01 DIAGNOSIS — S338XXA Sprain of other parts of lumbar spine and pelvis, initial encounter: Secondary | ICD-10-CM | POA: Diagnosis not present

## 2022-06-01 DIAGNOSIS — M9902 Segmental and somatic dysfunction of thoracic region: Secondary | ICD-10-CM | POA: Diagnosis not present

## 2022-06-01 DIAGNOSIS — S233XXA Sprain of ligaments of thoracic spine, initial encounter: Secondary | ICD-10-CM | POA: Diagnosis not present

## 2022-06-15 DIAGNOSIS — M9903 Segmental and somatic dysfunction of lumbar region: Secondary | ICD-10-CM | POA: Diagnosis not present

## 2022-06-15 DIAGNOSIS — M9902 Segmental and somatic dysfunction of thoracic region: Secondary | ICD-10-CM | POA: Diagnosis not present

## 2022-06-15 DIAGNOSIS — S338XXA Sprain of other parts of lumbar spine and pelvis, initial encounter: Secondary | ICD-10-CM | POA: Diagnosis not present

## 2022-06-15 DIAGNOSIS — S233XXA Sprain of ligaments of thoracic spine, initial encounter: Secondary | ICD-10-CM | POA: Diagnosis not present

## 2022-06-29 DIAGNOSIS — S233XXA Sprain of ligaments of thoracic spine, initial encounter: Secondary | ICD-10-CM | POA: Diagnosis not present

## 2022-06-29 DIAGNOSIS — M9903 Segmental and somatic dysfunction of lumbar region: Secondary | ICD-10-CM | POA: Diagnosis not present

## 2022-06-29 DIAGNOSIS — M9902 Segmental and somatic dysfunction of thoracic region: Secondary | ICD-10-CM | POA: Diagnosis not present

## 2022-06-29 DIAGNOSIS — S338XXA Sprain of other parts of lumbar spine and pelvis, initial encounter: Secondary | ICD-10-CM | POA: Diagnosis not present

## 2022-07-13 DIAGNOSIS — M9902 Segmental and somatic dysfunction of thoracic region: Secondary | ICD-10-CM | POA: Diagnosis not present

## 2022-07-13 DIAGNOSIS — M9903 Segmental and somatic dysfunction of lumbar region: Secondary | ICD-10-CM | POA: Diagnosis not present

## 2022-07-13 DIAGNOSIS — S233XXA Sprain of ligaments of thoracic spine, initial encounter: Secondary | ICD-10-CM | POA: Diagnosis not present

## 2022-07-13 DIAGNOSIS — S338XXA Sprain of other parts of lumbar spine and pelvis, initial encounter: Secondary | ICD-10-CM | POA: Diagnosis not present

## 2022-07-26 ENCOUNTER — Ambulatory Visit (INDEPENDENT_AMBULATORY_CARE_PROVIDER_SITE_OTHER): Payer: Medicare HMO

## 2022-07-26 DIAGNOSIS — I495 Sick sinus syndrome: Secondary | ICD-10-CM

## 2022-07-27 ENCOUNTER — Telehealth: Payer: Self-pay | Admitting: Cardiovascular Disease

## 2022-07-27 LAB — CUP PACEART REMOTE DEVICE CHECK
Battery Impedance: 1629 Ohm
Battery Remaining Longevity: 38 mo
Battery Voltage: 2.75 V
Brady Statistic AP VP Percent: 56 %
Brady Statistic AP VS Percent: 0 %
Brady Statistic AS VP Percent: 44 %
Brady Statistic AS VS Percent: 0 %
Date Time Interrogation Session: 20240103123243
Implantable Lead Connection Status: 753985
Implantable Lead Connection Status: 753985
Implantable Lead Implant Date: 20050805
Implantable Lead Implant Date: 20050805
Implantable Lead Location: 753859
Implantable Lead Location: 753860
Implantable Lead Model: 5076
Implantable Lead Model: 5076
Implantable Pulse Generator Implant Date: 20140627
Lead Channel Impedance Value: 455 Ohm
Lead Channel Impedance Value: 503 Ohm
Lead Channel Pacing Threshold Amplitude: 0.625 V
Lead Channel Pacing Threshold Amplitude: 0.625 V
Lead Channel Pacing Threshold Pulse Width: 0.4 ms
Lead Channel Pacing Threshold Pulse Width: 0.4 ms
Lead Channel Setting Pacing Amplitude: 2 V
Lead Channel Setting Pacing Amplitude: 2.5 V
Lead Channel Setting Pacing Pulse Width: 0.4 ms
Lead Channel Setting Sensing Sensitivity: 2 mV
Zone Setting Status: 755011
Zone Setting Status: 755011

## 2022-07-27 NOTE — Telephone Encounter (Signed)
Outreach made to Pt.  Advised remote transmission had been received.

## 2022-07-27 NOTE — Telephone Encounter (Signed)
  1. Has your device fired? no  2. Is you device beeping? no  3. Are you experiencing draining or swelling at device site? no  4. Are you calling to see if we received your device transmission? Unable to send transmission  5. Have you passed out? no    Please route to [place]

## 2022-07-29 ENCOUNTER — Encounter: Payer: Medicare HMO | Admitting: Internal Medicine

## 2022-08-03 DIAGNOSIS — H524 Presbyopia: Secondary | ICD-10-CM | POA: Diagnosis not present

## 2022-08-03 DIAGNOSIS — M9903 Segmental and somatic dysfunction of lumbar region: Secondary | ICD-10-CM | POA: Diagnosis not present

## 2022-08-03 DIAGNOSIS — S338XXA Sprain of other parts of lumbar spine and pelvis, initial encounter: Secondary | ICD-10-CM | POA: Diagnosis not present

## 2022-08-03 DIAGNOSIS — S233XXA Sprain of ligaments of thoracic spine, initial encounter: Secondary | ICD-10-CM | POA: Diagnosis not present

## 2022-08-03 DIAGNOSIS — M9902 Segmental and somatic dysfunction of thoracic region: Secondary | ICD-10-CM | POA: Diagnosis not present

## 2022-08-12 ENCOUNTER — Ambulatory Visit: Payer: Medicare HMO | Attending: Internal Medicine | Admitting: Cardiovascular Disease

## 2022-08-12 ENCOUNTER — Encounter: Payer: Self-pay | Admitting: Cardiovascular Disease

## 2022-08-12 VITALS — BP 168/86 | HR 84 | Ht 63.0 in | Wt 155.0 lb

## 2022-08-12 DIAGNOSIS — Z950 Presence of cardiac pacemaker: Secondary | ICD-10-CM

## 2022-08-12 DIAGNOSIS — I480 Paroxysmal atrial fibrillation: Secondary | ICD-10-CM

## 2022-08-12 NOTE — Patient Instructions (Signed)
Medication Instructions:  Continue all current medications.  Labwork: none  Testing/Procedures: none  Follow-Up: 1 year - Dr.  Myles Gip   Any Other Special Instructions Will Be Listed Below (If Applicable). Obtain home BP monitor.   If you need a refill on your cardiac medications before your next appointment, please call your pharmacy.

## 2022-08-12 NOTE — Progress Notes (Signed)
    PCP: Manon Hilding, MD   Primary EP:  Dr Myles Gip  Cassandra Young is a 88 y.o. female who presents today for routine electrophysiology followup.  Since last being seen in our clinic, the patient reports doing very well.  Today, she denies symptoms of palpitations, chest pain, shortness of breath,  lower extremity edema, dizziness, presyncope, or syncope.  The patient is otherwise without complaint today.   Past Medical History:  Diagnosis Date   Atrioventricular block, complete ([place])    s/p PPM implant originally in 2005 with generator change 2014 (MDT ADDR1 by Dr Rayann Heman)   Esophageal reflux    Hypertension    Pacemaker    Paroxysmal atrial fibrillation [facility])    Past Surgical History:  Procedure Laterality Date   CATARACT EXTRACTION W/PHACO Right 05/06/2013   Procedure: CATARACT EXTRACTION PHACO AND INTRAOCULAR LENS PLACEMENT (IOC) CDE=11.18;  Surgeon: Tonny Branch, MD;  Location: [facility];  Service: Ophthalmology;  Laterality: Right;   CATARACT EXTRACTION W/PHACO Left 05/30/2013   Procedure: CATARACT EXTRACTION PHACO AND INTRAOCULAR LENS PLACEMENT (IOC);  Surgeon: Tonny Branch, MD;  Location: [facility];  Service: Ophthalmology;  Laterality: Left;  CDE:13.93   PACEMAKER GENERATOR CHANGE  01/18/2013   MDT ADDR1 implanted by Dr Rayann Heman   PACEMAKER INSERTION  2005   Medtronic Kappa [id number]   PERMANENT PACEMAKER GENERATOR CHANGE Left 01/18/2013   Procedure: PERMANENT PACEMAKER GENERATOR CHANGE;  Surgeon: Thompson Grayer, MD;  Location: [facility] CATH LAB;  Service: Cardiovascular;  Laterality: Left;    ROS- all systems are reviewed and negative except as per HPI above  Current Outpatient Medications  Medication Sig Dispense Refill   Calcium Carbonate-Vit D-Min 600-400 MG-UNIT TABS Take 2 tablets by mouth daily.     ELIQUIS 5 MG TABS tablet TAKE 1 TABLET BY MOUTH TWICE A DAY 60 tablet 5   fish oil-omega-3 fatty acids 1000 MG capsule Take 1 g by mouth 2 (two) times daily.     glucosamine-chondroitin  500-400 MG tablet Take 1 tablet by mouth daily.     metoprolol tartrate (LOPRESSOR) 25 MG tablet Take 25 mg by mouth 2 (two) times daily.     rosuvastatin (CRESTOR) 5 MG tablet Take 5 mg by mouth every other day.     No current facility-administered medications for this visit.    Physical Exam: Vitals:   08/12/22 0950  BP: (!) 168/86  Pulse: 84  SpO2: 97%  Weight: 155 lb (70.3 kg)  Height: '5\' 3"'$  (1.6 m)    Gen: Appears comfortable, well-nourished CV: RRR, no dependent edema The device site is normal -- no tenderness, edema, drainage, redness, threatened erosion. Pulm: breathing easily   Pacemaker interrogation- reviewed in detail today,  See PACEART report    Assessment and Plan:  1. Symptomatic sinus bradycardia and transient complete heart block Normal pacemaker function See Pace Art report No changes today she is not device dependant today  2. HTN BP elevated -- she reports BP is good at PCP visits. I advised her to get a home BP monitor and keep a log of Bps to review with her PCP.   3. Paroxysmal atrial fibrillation AF burden is 16.2 % (previously >12.8%) She is unaware. Rates are reasonably controlled Continue eliquis for chads2vasc score of 4  Risks, benefits and potential toxicities for medications prescribed and/or refilled reviewed with patient today.   Return in a year  Melida Quitter, MD 08/12/2022 10:14 AM

## 2022-08-17 LAB — CUP PACEART INCLINIC DEVICE CHECK
Battery Impedance: 1654 Ohm
Battery Remaining Longevity: 38 mo
Battery Voltage: 2.74 V
Brady Statistic AP VP Percent: 56 %
Brady Statistic AP VS Percent: 0 %
Brady Statistic AS VP Percent: 44 %
Brady Statistic AS VS Percent: 0 %
Date Time Interrogation Session: 20240119101200
Implantable Lead Connection Status: 753985
Implantable Lead Connection Status: 753985
Implantable Lead Implant Date: 20050805
Implantable Lead Implant Date: 20050805
Implantable Lead Location: 753859
Implantable Lead Location: 753860
Implantable Lead Model: 5076
Implantable Lead Model: 5076
Implantable Pulse Generator Implant Date: 20140627
Lead Channel Impedance Value: 431 Ohm
Lead Channel Impedance Value: 489 Ohm
Lead Channel Pacing Threshold Amplitude: 0.5 V
Lead Channel Pacing Threshold Amplitude: 0.625 V
Lead Channel Pacing Threshold Amplitude: 0.75 V
Lead Channel Pacing Threshold Amplitude: 0.75 V
Lead Channel Pacing Threshold Pulse Width: 0.4 ms
Lead Channel Pacing Threshold Pulse Width: 0.4 ms
Lead Channel Pacing Threshold Pulse Width: 0.4 ms
Lead Channel Pacing Threshold Pulse Width: 0.4 ms
Lead Channel Sensing Intrinsic Amplitude: 2 mV
Lead Channel Setting Pacing Amplitude: 2 V
Lead Channel Setting Pacing Amplitude: 2.5 V
Lead Channel Setting Pacing Pulse Width: 0.4 ms
Lead Channel Setting Sensing Sensitivity: 2 mV
Zone Setting Status: 755011
Zone Setting Status: 755011

## 2022-08-24 DIAGNOSIS — M9902 Segmental and somatic dysfunction of thoracic region: Secondary | ICD-10-CM | POA: Diagnosis not present

## 2022-08-24 DIAGNOSIS — S233XXA Sprain of ligaments of thoracic spine, initial encounter: Secondary | ICD-10-CM | POA: Diagnosis not present

## 2022-08-24 DIAGNOSIS — S338XXA Sprain of other parts of lumbar spine and pelvis, initial encounter: Secondary | ICD-10-CM | POA: Diagnosis not present

## 2022-08-24 DIAGNOSIS — M9903 Segmental and somatic dysfunction of lumbar region: Secondary | ICD-10-CM | POA: Diagnosis not present

## 2022-08-26 ENCOUNTER — Other Ambulatory Visit: Payer: Self-pay

## 2022-08-26 DIAGNOSIS — R7301 Impaired fasting glucose: Secondary | ICD-10-CM | POA: Diagnosis not present

## 2022-08-26 DIAGNOSIS — I4891 Unspecified atrial fibrillation: Secondary | ICD-10-CM

## 2022-08-26 DIAGNOSIS — E7801 Familial hypercholesterolemia: Secondary | ICD-10-CM | POA: Diagnosis not present

## 2022-08-26 DIAGNOSIS — N183 Chronic kidney disease, stage 3 unspecified: Secondary | ICD-10-CM | POA: Diagnosis not present

## 2022-08-26 DIAGNOSIS — E7849 Other hyperlipidemia: Secondary | ICD-10-CM | POA: Diagnosis not present

## 2022-08-26 MED ORDER — APIXABAN 5 MG PO TABS: 5.0000 mg | ORAL_TABLET | Freq: Two times a day (BID) | ORAL | 5 refills | Status: DC

## 2022-08-26 NOTE — Progress Notes (Signed)
Remote pacemaker transmission.   

## 2022-08-26 NOTE — Telephone Encounter (Signed)
Prescription refill request for Eliquis received. Indication:afib Last office visit:1/24 Scr:1.1 KPN Age: 88 Weight:70.3 kg  Prescription refilled

## 2022-08-31 DIAGNOSIS — N1831 Chronic kidney disease, stage 3a: Secondary | ICD-10-CM | POA: Diagnosis not present

## 2022-08-31 DIAGNOSIS — Z23 Encounter for immunization: Secondary | ICD-10-CM | POA: Diagnosis not present

## 2022-08-31 DIAGNOSIS — E7849 Other hyperlipidemia: Secondary | ICD-10-CM | POA: Diagnosis not present

## 2022-08-31 DIAGNOSIS — I10 Essential (primary) hypertension: Secondary | ICD-10-CM | POA: Diagnosis not present

## 2022-08-31 DIAGNOSIS — Z6828 Body mass index (BMI) 28.0-28.9, adult: Secondary | ICD-10-CM | POA: Diagnosis not present

## 2022-08-31 DIAGNOSIS — I482 Chronic atrial fibrillation, unspecified: Secondary | ICD-10-CM | POA: Diagnosis not present

## 2022-08-31 DIAGNOSIS — R7301 Impaired fasting glucose: Secondary | ICD-10-CM | POA: Diagnosis not present

## 2022-09-14 DIAGNOSIS — S233XXA Sprain of ligaments of thoracic spine, initial encounter: Secondary | ICD-10-CM | POA: Diagnosis not present

## 2022-09-14 DIAGNOSIS — S338XXA Sprain of other parts of lumbar spine and pelvis, initial encounter: Secondary | ICD-10-CM | POA: Diagnosis not present

## 2022-09-14 DIAGNOSIS — M9902 Segmental and somatic dysfunction of thoracic region: Secondary | ICD-10-CM | POA: Diagnosis not present

## 2022-09-14 DIAGNOSIS — M9903 Segmental and somatic dysfunction of lumbar region: Secondary | ICD-10-CM | POA: Diagnosis not present

## 2022-10-11 DIAGNOSIS — Z7901 Long term (current) use of anticoagulants: Secondary | ICD-10-CM | POA: Diagnosis not present

## 2022-10-11 DIAGNOSIS — I4891 Unspecified atrial fibrillation: Secondary | ICD-10-CM | POA: Diagnosis not present

## 2022-10-11 DIAGNOSIS — Z823 Family history of stroke: Secondary | ICD-10-CM | POA: Diagnosis not present

## 2022-10-11 DIAGNOSIS — Z8249 Family history of ischemic heart disease and other diseases of the circulatory system: Secondary | ICD-10-CM | POA: Diagnosis not present

## 2022-10-11 DIAGNOSIS — E785 Hyperlipidemia, unspecified: Secondary | ICD-10-CM | POA: Diagnosis not present

## 2022-10-11 DIAGNOSIS — D6869 Other thrombophilia: Secondary | ICD-10-CM | POA: Diagnosis not present

## 2022-10-11 DIAGNOSIS — Z803 Family history of malignant neoplasm of breast: Secondary | ICD-10-CM | POA: Diagnosis not present

## 2022-10-11 DIAGNOSIS — Z950 Presence of cardiac pacemaker: Secondary | ICD-10-CM | POA: Diagnosis not present

## 2022-10-11 DIAGNOSIS — I10 Essential (primary) hypertension: Secondary | ICD-10-CM | POA: Diagnosis not present

## 2022-10-11 DIAGNOSIS — I495 Sick sinus syndrome: Secondary | ICD-10-CM | POA: Diagnosis not present

## 2022-10-12 DIAGNOSIS — M9902 Segmental and somatic dysfunction of thoracic region: Secondary | ICD-10-CM | POA: Diagnosis not present

## 2022-10-12 DIAGNOSIS — M9903 Segmental and somatic dysfunction of lumbar region: Secondary | ICD-10-CM | POA: Diagnosis not present

## 2022-10-12 DIAGNOSIS — S338XXA Sprain of other parts of lumbar spine and pelvis, initial encounter: Secondary | ICD-10-CM | POA: Diagnosis not present

## 2022-10-12 DIAGNOSIS — S233XXA Sprain of ligaments of thoracic spine, initial encounter: Secondary | ICD-10-CM | POA: Diagnosis not present

## 2022-10-25 ENCOUNTER — Ambulatory Visit (INDEPENDENT_AMBULATORY_CARE_PROVIDER_SITE_OTHER): Payer: Medicare HMO

## 2022-10-25 DIAGNOSIS — I495 Sick sinus syndrome: Secondary | ICD-10-CM | POA: Diagnosis not present

## 2022-10-28 LAB — CUP PACEART REMOTE DEVICE CHECK
Battery Impedance: 1799 Ohm
Battery Remaining Longevity: 35 mo
Battery Voltage: 2.74 V
Brady Statistic AP VP Percent: 55 %
Brady Statistic AP VS Percent: 0 %
Brady Statistic AS VP Percent: 45 %
Brady Statistic AS VS Percent: 0 %
Date Time Interrogation Session: 20240404155159
Implantable Lead Connection Status: 753985
Implantable Lead Connection Status: 753985
Implantable Lead Implant Date: 20050805
Implantable Lead Implant Date: 20050805
Implantable Lead Location: 753859
Implantable Lead Location: 753860
Implantable Lead Model: 5076
Implantable Lead Model: 5076
Implantable Pulse Generator Implant Date: 20140627
Lead Channel Impedance Value: 449 Ohm
Lead Channel Impedance Value: 515 Ohm
Lead Channel Pacing Threshold Amplitude: 0.625 V
Lead Channel Pacing Threshold Amplitude: 0.625 V
Lead Channel Pacing Threshold Pulse Width: 0.4 ms
Lead Channel Pacing Threshold Pulse Width: 0.4 ms
Lead Channel Setting Pacing Amplitude: 2 V
Lead Channel Setting Pacing Amplitude: 2.5 V
Lead Channel Setting Pacing Pulse Width: 0.4 ms
Lead Channel Setting Sensing Sensitivity: 2 mV
Zone Setting Status: 755011
Zone Setting Status: 755011

## 2022-11-08 DIAGNOSIS — M9902 Segmental and somatic dysfunction of thoracic region: Secondary | ICD-10-CM | POA: Diagnosis not present

## 2022-11-08 DIAGNOSIS — M9903 Segmental and somatic dysfunction of lumbar region: Secondary | ICD-10-CM | POA: Diagnosis not present

## 2022-11-08 DIAGNOSIS — S338XXA Sprain of other parts of lumbar spine and pelvis, initial encounter: Secondary | ICD-10-CM | POA: Diagnosis not present

## 2022-11-08 DIAGNOSIS — S233XXA Sprain of ligaments of thoracic spine, initial encounter: Secondary | ICD-10-CM | POA: Diagnosis not present

## 2022-11-30 DIAGNOSIS — L570 Actinic keratosis: Secondary | ICD-10-CM | POA: Diagnosis not present

## 2022-12-06 DIAGNOSIS — M9903 Segmental and somatic dysfunction of lumbar region: Secondary | ICD-10-CM | POA: Diagnosis not present

## 2022-12-06 DIAGNOSIS — M9902 Segmental and somatic dysfunction of thoracic region: Secondary | ICD-10-CM | POA: Diagnosis not present

## 2022-12-06 DIAGNOSIS — S338XXA Sprain of other parts of lumbar spine and pelvis, initial encounter: Secondary | ICD-10-CM | POA: Diagnosis not present

## 2022-12-06 DIAGNOSIS — S233XXA Sprain of ligaments of thoracic spine, initial encounter: Secondary | ICD-10-CM | POA: Diagnosis not present

## 2022-12-07 NOTE — Progress Notes (Signed)
Remote pacemaker transmission.   

## 2023-01-03 DIAGNOSIS — S233XXA Sprain of ligaments of thoracic spine, initial encounter: Secondary | ICD-10-CM | POA: Diagnosis not present

## 2023-01-03 DIAGNOSIS — M9903 Segmental and somatic dysfunction of lumbar region: Secondary | ICD-10-CM | POA: Diagnosis not present

## 2023-01-03 DIAGNOSIS — S338XXA Sprain of other parts of lumbar spine and pelvis, initial encounter: Secondary | ICD-10-CM | POA: Diagnosis not present

## 2023-01-03 DIAGNOSIS — M9902 Segmental and somatic dysfunction of thoracic region: Secondary | ICD-10-CM | POA: Diagnosis not present

## 2023-01-24 ENCOUNTER — Ambulatory Visit: Payer: Medicare HMO

## 2023-01-24 DIAGNOSIS — I495 Sick sinus syndrome: Secondary | ICD-10-CM | POA: Diagnosis not present

## 2023-01-26 LAB — CUP PACEART REMOTE DEVICE CHECK
Battery Impedance: 1861 Ohm
Battery Remaining Longevity: 33 mo
Battery Voltage: 2.74 V
Brady Statistic AP VP Percent: 50 %
Brady Statistic AP VS Percent: 0 %
Brady Statistic AS VP Percent: 50 %
Brady Statistic AS VS Percent: 0 %
Date Time Interrogation Session: 20240703163646
Implantable Lead Connection Status: 753985
Implantable Lead Connection Status: 753985
Implantable Lead Implant Date: 20050805
Implantable Lead Implant Date: 20050805
Implantable Lead Location: 753859
Implantable Lead Location: 753860
Implantable Lead Model: 5076
Implantable Lead Model: 5076
Implantable Pulse Generator Implant Date: 20140627
Lead Channel Impedance Value: 414 Ohm
Lead Channel Impedance Value: 480 Ohm
Lead Channel Pacing Threshold Amplitude: 0.5 V
Lead Channel Pacing Threshold Amplitude: 0.625 V
Lead Channel Pacing Threshold Pulse Width: 0.4 ms
Lead Channel Pacing Threshold Pulse Width: 0.4 ms
Lead Channel Setting Pacing Amplitude: 2 V
Lead Channel Setting Pacing Amplitude: 2.5 V
Lead Channel Setting Pacing Pulse Width: 0.4 ms
Lead Channel Setting Sensing Sensitivity: 2 mV
Zone Setting Status: 755011
Zone Setting Status: 755011

## 2023-01-31 DIAGNOSIS — S233XXA Sprain of ligaments of thoracic spine, initial encounter: Secondary | ICD-10-CM | POA: Diagnosis not present

## 2023-01-31 DIAGNOSIS — M9903 Segmental and somatic dysfunction of lumbar region: Secondary | ICD-10-CM | POA: Diagnosis not present

## 2023-01-31 DIAGNOSIS — M9902 Segmental and somatic dysfunction of thoracic region: Secondary | ICD-10-CM | POA: Diagnosis not present

## 2023-01-31 DIAGNOSIS — S338XXA Sprain of other parts of lumbar spine and pelvis, initial encounter: Secondary | ICD-10-CM | POA: Diagnosis not present

## 2023-02-11 ENCOUNTER — Other Ambulatory Visit: Payer: Self-pay | Admitting: Cardiovascular Disease

## 2023-02-11 DIAGNOSIS — I4891 Unspecified atrial fibrillation: Secondary | ICD-10-CM

## 2023-02-13 NOTE — Telephone Encounter (Signed)
Prescription refill request for Eliquis received. Indication:afib Last office visit:1/24 Scr:1.0  2/24 Age: 88 Weight:70.3  kg  Prescription refilled

## 2023-02-17 NOTE — Progress Notes (Signed)
Remote pacemaker transmission.   

## 2023-02-28 DIAGNOSIS — S233XXA Sprain of ligaments of thoracic spine, initial encounter: Secondary | ICD-10-CM | POA: Diagnosis not present

## 2023-02-28 DIAGNOSIS — S338XXA Sprain of other parts of lumbar spine and pelvis, initial encounter: Secondary | ICD-10-CM | POA: Diagnosis not present

## 2023-02-28 DIAGNOSIS — M9903 Segmental and somatic dysfunction of lumbar region: Secondary | ICD-10-CM | POA: Diagnosis not present

## 2023-02-28 DIAGNOSIS — M9902 Segmental and somatic dysfunction of thoracic region: Secondary | ICD-10-CM | POA: Diagnosis not present

## 2023-03-28 DIAGNOSIS — M9902 Segmental and somatic dysfunction of thoracic region: Secondary | ICD-10-CM | POA: Diagnosis not present

## 2023-03-28 DIAGNOSIS — S233XXA Sprain of ligaments of thoracic spine, initial encounter: Secondary | ICD-10-CM | POA: Diagnosis not present

## 2023-03-28 DIAGNOSIS — S338XXA Sprain of other parts of lumbar spine and pelvis, initial encounter: Secondary | ICD-10-CM | POA: Diagnosis not present

## 2023-03-28 DIAGNOSIS — M9903 Segmental and somatic dysfunction of lumbar region: Secondary | ICD-10-CM | POA: Diagnosis not present

## 2023-03-29 DIAGNOSIS — E7801 Familial hypercholesterolemia: Secondary | ICD-10-CM | POA: Diagnosis not present

## 2023-03-29 DIAGNOSIS — N1831 Chronic kidney disease, stage 3a: Secondary | ICD-10-CM | POA: Diagnosis not present

## 2023-03-29 DIAGNOSIS — E7849 Other hyperlipidemia: Secondary | ICD-10-CM | POA: Diagnosis not present

## 2023-03-29 DIAGNOSIS — R7301 Impaired fasting glucose: Secondary | ICD-10-CM | POA: Diagnosis not present

## 2023-04-05 DIAGNOSIS — N1831 Chronic kidney disease, stage 3a: Secondary | ICD-10-CM | POA: Diagnosis not present

## 2023-04-05 DIAGNOSIS — I10 Essential (primary) hypertension: Secondary | ICD-10-CM | POA: Diagnosis not present

## 2023-04-05 DIAGNOSIS — I482 Chronic atrial fibrillation, unspecified: Secondary | ICD-10-CM | POA: Diagnosis not present

## 2023-04-05 DIAGNOSIS — E7849 Other hyperlipidemia: Secondary | ICD-10-CM | POA: Diagnosis not present

## 2023-04-05 DIAGNOSIS — Z6828 Body mass index (BMI) 28.0-28.9, adult: Secondary | ICD-10-CM | POA: Diagnosis not present

## 2023-04-05 DIAGNOSIS — R7301 Impaired fasting glucose: Secondary | ICD-10-CM | POA: Diagnosis not present

## 2023-04-05 DIAGNOSIS — Z23 Encounter for immunization: Secondary | ICD-10-CM | POA: Diagnosis not present

## 2023-04-25 DIAGNOSIS — S233XXA Sprain of ligaments of thoracic spine, initial encounter: Secondary | ICD-10-CM | POA: Diagnosis not present

## 2023-04-25 DIAGNOSIS — M9903 Segmental and somatic dysfunction of lumbar region: Secondary | ICD-10-CM | POA: Diagnosis not present

## 2023-04-25 DIAGNOSIS — M9902 Segmental and somatic dysfunction of thoracic region: Secondary | ICD-10-CM | POA: Diagnosis not present

## 2023-04-25 DIAGNOSIS — S338XXA Sprain of other parts of lumbar spine and pelvis, initial encounter: Secondary | ICD-10-CM | POA: Diagnosis not present

## 2023-04-27 ENCOUNTER — Telehealth: Payer: Self-pay

## 2023-04-27 NOTE — Telephone Encounter (Signed)
[facility] is sending the patient a new monitor in 7-10 business days.

## 2023-05-23 DIAGNOSIS — S338XXA Sprain of other parts of lumbar spine and pelvis, initial encounter: Secondary | ICD-10-CM | POA: Diagnosis not present

## 2023-05-23 DIAGNOSIS — S233XXA Sprain of ligaments of thoracic spine, initial encounter: Secondary | ICD-10-CM | POA: Diagnosis not present

## 2023-05-23 DIAGNOSIS — M9902 Segmental and somatic dysfunction of thoracic region: Secondary | ICD-10-CM | POA: Diagnosis not present

## 2023-05-23 DIAGNOSIS — M9903 Segmental and somatic dysfunction of lumbar region: Secondary | ICD-10-CM | POA: Diagnosis not present

## 2023-06-19 DIAGNOSIS — L821 Other seborrheic keratosis: Secondary | ICD-10-CM | POA: Diagnosis not present

## 2023-06-19 DIAGNOSIS — L570 Actinic keratosis: Secondary | ICD-10-CM | POA: Diagnosis not present

## 2023-06-19 DIAGNOSIS — Z85828 Personal history of other malignant neoplasm of skin: Secondary | ICD-10-CM | POA: Diagnosis not present

## 2023-06-20 DIAGNOSIS — M9903 Segmental and somatic dysfunction of lumbar region: Secondary | ICD-10-CM | POA: Diagnosis not present

## 2023-06-20 DIAGNOSIS — S233XXA Sprain of ligaments of thoracic spine, initial encounter: Secondary | ICD-10-CM | POA: Diagnosis not present

## 2023-06-20 DIAGNOSIS — M9902 Segmental and somatic dysfunction of thoracic region: Secondary | ICD-10-CM | POA: Diagnosis not present

## 2023-06-20 DIAGNOSIS — S338XXA Sprain of other parts of lumbar spine and pelvis, initial encounter: Secondary | ICD-10-CM | POA: Diagnosis not present

## 2023-07-25 ENCOUNTER — Ambulatory Visit (INDEPENDENT_AMBULATORY_CARE_PROVIDER_SITE_OTHER): Payer: Medicare HMO

## 2023-07-25 DIAGNOSIS — S338XXA Sprain of other parts of lumbar spine and pelvis, initial encounter: Secondary | ICD-10-CM | POA: Diagnosis not present

## 2023-07-25 DIAGNOSIS — I442 Atrioventricular block, complete: Secondary | ICD-10-CM | POA: Diagnosis not present

## 2023-07-25 DIAGNOSIS — M9902 Segmental and somatic dysfunction of thoracic region: Secondary | ICD-10-CM | POA: Diagnosis not present

## 2023-07-25 DIAGNOSIS — I495 Sick sinus syndrome: Secondary | ICD-10-CM

## 2023-07-25 DIAGNOSIS — S233XXA Sprain of ligaments of thoracic spine, initial encounter: Secondary | ICD-10-CM | POA: Diagnosis not present

## 2023-07-25 DIAGNOSIS — M9903 Segmental and somatic dysfunction of lumbar region: Secondary | ICD-10-CM | POA: Diagnosis not present

## 2023-07-28 ENCOUNTER — Encounter: Payer: Medicare HMO | Admitting: Cardiovascular Disease

## 2023-07-30 LAB — CUP PACEART REMOTE DEVICE CHECK
Battery Impedance: 2030 Ohm
Battery Remaining Longevity: 31 mo
Battery Voltage: 2.74 V
Brady Statistic AP VP Percent: 57 %
Brady Statistic AP VS Percent: 0 %
Brady Statistic AS VP Percent: 43 %
Brady Statistic AS VS Percent: 0 %
Date Time Interrogation Session: 20250103155734
Implantable Lead Connection Status: 753985
Implantable Lead Connection Status: 753985
Implantable Lead Implant Date: 20050805
Implantable Lead Implant Date: 20050805
Implantable Lead Location: 753859
Implantable Lead Location: 753860
Implantable Lead Model: 5076
Implantable Lead Model: 5076
Implantable Pulse Generator Implant Date: 20140627
Lead Channel Impedance Value: 455 Ohm
Lead Channel Impedance Value: 542 Ohm
Lead Channel Pacing Threshold Amplitude: 0.625 V
Lead Channel Pacing Threshold Amplitude: 0.625 V
Lead Channel Pacing Threshold Pulse Width: 0.4 ms
Lead Channel Pacing Threshold Pulse Width: 0.4 ms
Lead Channel Setting Pacing Amplitude: 2 V
Lead Channel Setting Pacing Amplitude: 2.5 V
Lead Channel Setting Pacing Pulse Width: 0.4 ms
Lead Channel Setting Sensing Sensitivity: 2 mV
Zone Setting Status: 755011
Zone Setting Status: 755011

## 2023-08-04 ENCOUNTER — Encounter: Payer: Self-pay | Admitting: Cardiovascular Disease

## 2023-08-04 ENCOUNTER — Ambulatory Visit: Payer: Medicare HMO | Attending: Cardiovascular Disease | Admitting: Cardiovascular Disease

## 2023-08-04 VITALS — BP 150/68 | HR 76 | Ht 63.0 in | Wt 157.0 lb

## 2023-08-04 DIAGNOSIS — I10 Essential (primary) hypertension: Secondary | ICD-10-CM | POA: Diagnosis not present

## 2023-08-04 DIAGNOSIS — I495 Sick sinus syndrome: Secondary | ICD-10-CM

## 2023-08-04 DIAGNOSIS — Z7901 Long term (current) use of anticoagulants: Secondary | ICD-10-CM | POA: Diagnosis not present

## 2023-08-04 DIAGNOSIS — Z5181 Encounter for therapeutic drug level monitoring: Secondary | ICD-10-CM

## 2023-08-04 DIAGNOSIS — Z79899 Other long term (current) drug therapy: Secondary | ICD-10-CM | POA: Diagnosis not present

## 2023-08-04 NOTE — Patient Instructions (Signed)
 Medication Instructions:   Continue all current medications.   Labwork:  BMET, CBC - orders given today Office will contact with results via phone, letter or mychart.     Testing/Procedures:  none  Follow-Up:  6 months   Any Other Special Instructions Will Be Listed Below (If Applicable).   If you need a refill on your cardiac medications before your next appointment, please call your pharmacy.

## 2023-08-04 NOTE — Progress Notes (Signed)
    PCP: Atilano Deward ORN, MD   Primary EP:  Dr Nancey  Cassandra Young is a 89 y.o. female who presents today for routine electrophysiology followup.  Since last being seen in our clinic, the patient reports doing very well.    Today, she denies symptoms of palpitations, chest pain, shortness of breath,  lower extremity edema, dizziness, presyncope, or syncope.  she has no device related complaints -- no new tenderness, drainage, redness.  The patient is otherwise without complaint today.     Physical Exam: Vitals:   08/04/23 0848 08/04/23 0900  BP: (!) 154/76 (!) 150/68  Pulse: 76   Weight: 157 lb (71.2 kg)   Height: 5' 3 (1.6 m)     Gen: Appears comfortable, well-nourished CV: RRR, no dependent edema The device site is normal -- no tenderness, edema, drainage, redness, threatened erosion. Pulm: breathing easily   Pacemaker interrogation- reviewed in detail today,  See PACEART report    Assessment and Plan:  1. Symptomatic sinus bradycardia and transient complete heart block Normal pacemaker function See Pace Art report No changes today she is not device dependant today  2. HTN BP elevated -- she reports BP is good at PCP visits. I advised her to get a home BP monitor and keep a log of Bps to review with her PCP.   3. Paroxysmal atrial fibrillation AF burden is 18 % (previously >16.2%) She is unaware. Rates are controlled Continue eliquis  for chads2vasc score of 4 Check Bmet and CMP  Risks, benefits and potential toxicities for medications prescribed and/or refilled reviewed with patient today.   Return in a year  Cassandra Young Nancey, MD 08/04/2023 9:16 AM

## 2023-08-04 NOTE — Addendum Note (Signed)
 Addended by: Lesle Chris on: 08/04/2023 09:28 AM   Modules accepted: Orders

## 2023-08-07 LAB — CUP PACEART INCLINIC DEVICE CHECK
Brady Statistic RA Percent Paced: 57 %
Brady Statistic RV Percent Paced: 100 %
Date Time Interrogation Session: 20250110071125
Implantable Lead Connection Status: 753985
Implantable Lead Connection Status: 753985
Implantable Lead Implant Date: 20050805
Implantable Lead Implant Date: 20050805
Implantable Lead Location: 753859
Implantable Lead Location: 753860
Implantable Lead Model: 5076
Implantable Lead Model: 5076
Implantable Pulse Generator Implant Date: 20140627
Lead Channel Impedance Value: 442 Ohm
Lead Channel Impedance Value: 495 Ohm
Lead Channel Pacing Threshold Amplitude: 0.5 V
Lead Channel Pacing Threshold Pulse Width: 0.4 ms
Lead Channel Sensing Intrinsic Amplitude: 2.8 mV

## 2023-08-10 DIAGNOSIS — I10 Essential (primary) hypertension: Secondary | ICD-10-CM | POA: Diagnosis not present

## 2023-08-10 DIAGNOSIS — N1831 Chronic kidney disease, stage 3a: Secondary | ICD-10-CM | POA: Diagnosis not present

## 2023-08-22 DIAGNOSIS — M9903 Segmental and somatic dysfunction of lumbar region: Secondary | ICD-10-CM | POA: Diagnosis not present

## 2023-08-22 DIAGNOSIS — S233XXA Sprain of ligaments of thoracic spine, initial encounter: Secondary | ICD-10-CM | POA: Diagnosis not present

## 2023-08-22 DIAGNOSIS — S338XXA Sprain of other parts of lumbar spine and pelvis, initial encounter: Secondary | ICD-10-CM | POA: Diagnosis not present

## 2023-08-22 DIAGNOSIS — M9902 Segmental and somatic dysfunction of thoracic region: Secondary | ICD-10-CM | POA: Diagnosis not present

## 2023-08-26 DIAGNOSIS — Z7901 Long term (current) use of anticoagulants: Secondary | ICD-10-CM | POA: Diagnosis not present

## 2023-08-26 DIAGNOSIS — Z008 Encounter for other general examination: Secondary | ICD-10-CM | POA: Diagnosis not present

## 2023-08-26 DIAGNOSIS — N189 Chronic kidney disease, unspecified: Secondary | ICD-10-CM | POA: Diagnosis not present

## 2023-08-26 DIAGNOSIS — M810 Age-related osteoporosis without current pathological fracture: Secondary | ICD-10-CM | POA: Diagnosis not present

## 2023-08-26 DIAGNOSIS — Z823 Family history of stroke: Secondary | ICD-10-CM | POA: Diagnosis not present

## 2023-08-26 DIAGNOSIS — I509 Heart failure, unspecified: Secondary | ICD-10-CM | POA: Diagnosis not present

## 2023-08-26 DIAGNOSIS — I130 Hypertensive heart and chronic kidney disease with heart failure and stage 1 through stage 4 chronic kidney disease, or unspecified chronic kidney disease: Secondary | ICD-10-CM | POA: Diagnosis not present

## 2023-08-26 DIAGNOSIS — Z809 Family history of malignant neoplasm, unspecified: Secondary | ICD-10-CM | POA: Diagnosis not present

## 2023-08-26 DIAGNOSIS — I252 Old myocardial infarction: Secondary | ICD-10-CM | POA: Diagnosis not present

## 2023-08-26 DIAGNOSIS — I4891 Unspecified atrial fibrillation: Secondary | ICD-10-CM | POA: Diagnosis not present

## 2023-08-26 DIAGNOSIS — Z8249 Family history of ischemic heart disease and other diseases of the circulatory system: Secondary | ICD-10-CM | POA: Diagnosis not present

## 2023-08-26 DIAGNOSIS — D6869 Other thrombophilia: Secondary | ICD-10-CM | POA: Diagnosis not present

## 2023-08-26 DIAGNOSIS — E785 Hyperlipidemia, unspecified: Secondary | ICD-10-CM | POA: Diagnosis not present

## 2023-09-05 NOTE — Addendum Note (Signed)
Addended by: Geralyn Flash D on: 09/05/2023 01:28 PM   Modules accepted: Orders

## 2023-09-05 NOTE — Progress Notes (Signed)
Remote pacemaker transmission.

## 2023-09-07 ENCOUNTER — Other Ambulatory Visit: Payer: Self-pay | Admitting: Cardiovascular Disease

## 2023-09-07 DIAGNOSIS — I4891 Unspecified atrial fibrillation: Secondary | ICD-10-CM

## 2023-09-07 NOTE — Telephone Encounter (Signed)
Pt last saw Dr Nelly Laurence 08/04/23, last labs 08/10/23 Creat 1.06, age 89, weight 71.2kg, based on specified criteria pt is on appropriate dosage of Eliquis 5mg  BID for afib.  Will refill rx.

## 2023-09-19 DIAGNOSIS — M9903 Segmental and somatic dysfunction of lumbar region: Secondary | ICD-10-CM | POA: Diagnosis not present

## 2023-09-19 DIAGNOSIS — S233XXA Sprain of ligaments of thoracic spine, initial encounter: Secondary | ICD-10-CM | POA: Diagnosis not present

## 2023-09-19 DIAGNOSIS — S338XXA Sprain of other parts of lumbar spine and pelvis, initial encounter: Secondary | ICD-10-CM | POA: Diagnosis not present

## 2023-09-19 DIAGNOSIS — M9902 Segmental and somatic dysfunction of thoracic region: Secondary | ICD-10-CM | POA: Diagnosis not present

## 2023-10-09 DIAGNOSIS — I10 Essential (primary) hypertension: Secondary | ICD-10-CM | POA: Diagnosis not present

## 2023-10-09 DIAGNOSIS — Z6828 Body mass index (BMI) 28.0-28.9, adult: Secondary | ICD-10-CM | POA: Diagnosis not present

## 2023-10-09 DIAGNOSIS — E7849 Other hyperlipidemia: Secondary | ICD-10-CM | POA: Diagnosis not present

## 2023-10-09 DIAGNOSIS — N1832 Chronic kidney disease, stage 3b: Secondary | ICD-10-CM | POA: Diagnosis not present

## 2023-10-09 DIAGNOSIS — I482 Chronic atrial fibrillation, unspecified: Secondary | ICD-10-CM | POA: Diagnosis not present

## 2023-10-09 DIAGNOSIS — E782 Mixed hyperlipidemia: Secondary | ICD-10-CM | POA: Diagnosis not present

## 2023-10-24 ENCOUNTER — Ambulatory Visit (INDEPENDENT_AMBULATORY_CARE_PROVIDER_SITE_OTHER): Payer: Medicare HMO

## 2023-10-24 DIAGNOSIS — M9903 Segmental and somatic dysfunction of lumbar region: Secondary | ICD-10-CM | POA: Diagnosis not present

## 2023-10-24 DIAGNOSIS — S338XXA Sprain of other parts of lumbar spine and pelvis, initial encounter: Secondary | ICD-10-CM | POA: Diagnosis not present

## 2023-10-24 DIAGNOSIS — I495 Sick sinus syndrome: Secondary | ICD-10-CM | POA: Diagnosis not present

## 2023-10-24 DIAGNOSIS — M9902 Segmental and somatic dysfunction of thoracic region: Secondary | ICD-10-CM | POA: Diagnosis not present

## 2023-10-24 DIAGNOSIS — S233XXA Sprain of ligaments of thoracic spine, initial encounter: Secondary | ICD-10-CM | POA: Diagnosis not present

## 2023-10-25 LAB — CUP PACEART REMOTE DEVICE CHECK
Battery Impedance: 2071 Ohm
Battery Remaining Longevity: 30 mo
Battery Voltage: 2.73 V
Brady Statistic AP VP Percent: 59 %
Brady Statistic AP VS Percent: 0 %
Brady Statistic AS VP Percent: 41 %
Brady Statistic AS VS Percent: 0 %
Date Time Interrogation Session: 20250402140335
Implantable Lead Connection Status: 753985
Implantable Lead Connection Status: 753985
Implantable Lead Implant Date: 20050805
Implantable Lead Implant Date: 20050805
Implantable Lead Location: 753859
Implantable Lead Location: 753860
Implantable Lead Model: 5076
Implantable Lead Model: 5076
Implantable Pulse Generator Implant Date: 20140627
Lead Channel Impedance Value: 434 Ohm
Lead Channel Impedance Value: 504 Ohm
Lead Channel Pacing Threshold Amplitude: 0.5 V
Lead Channel Pacing Threshold Amplitude: 0.625 V
Lead Channel Pacing Threshold Pulse Width: 0.4 ms
Lead Channel Pacing Threshold Pulse Width: 0.4 ms
Lead Channel Setting Pacing Amplitude: 2 V
Lead Channel Setting Pacing Amplitude: 2.5 V
Lead Channel Setting Pacing Pulse Width: 0.4 ms
Lead Channel Setting Sensing Sensitivity: 2 mV
Zone Setting Status: 755011
Zone Setting Status: 755011

## 2023-11-28 DIAGNOSIS — S338XXA Sprain of other parts of lumbar spine and pelvis, initial encounter: Secondary | ICD-10-CM | POA: Diagnosis not present

## 2023-11-28 DIAGNOSIS — M9903 Segmental and somatic dysfunction of lumbar region: Secondary | ICD-10-CM | POA: Diagnosis not present

## 2023-11-28 DIAGNOSIS — S233XXA Sprain of ligaments of thoracic spine, initial encounter: Secondary | ICD-10-CM | POA: Diagnosis not present

## 2023-11-28 DIAGNOSIS — M9902 Segmental and somatic dysfunction of thoracic region: Secondary | ICD-10-CM | POA: Diagnosis not present

## 2023-12-07 NOTE — Addendum Note (Signed)
 Addended by: Lott Rouleau A on: 12/07/2023 10:13 AM   Modules accepted: Orders

## 2023-12-07 NOTE — Progress Notes (Signed)
 Remote pacemaker transmission.

## 2023-12-11 DIAGNOSIS — L570 Actinic keratosis: Secondary | ICD-10-CM | POA: Diagnosis not present

## 2023-12-11 DIAGNOSIS — D485 Neoplasm of uncertain behavior of skin: Secondary | ICD-10-CM | POA: Diagnosis not present

## 2023-12-26 DIAGNOSIS — M9903 Segmental and somatic dysfunction of lumbar region: Secondary | ICD-10-CM | POA: Diagnosis not present

## 2023-12-26 DIAGNOSIS — S233XXA Sprain of ligaments of thoracic spine, initial encounter: Secondary | ICD-10-CM | POA: Diagnosis not present

## 2023-12-26 DIAGNOSIS — M9902 Segmental and somatic dysfunction of thoracic region: Secondary | ICD-10-CM | POA: Diagnosis not present

## 2023-12-26 DIAGNOSIS — S338XXA Sprain of other parts of lumbar spine and pelvis, initial encounter: Secondary | ICD-10-CM | POA: Diagnosis not present

## 2024-01-23 ENCOUNTER — Ambulatory Visit: Payer: Medicare HMO

## 2024-01-23 DIAGNOSIS — I495 Sick sinus syndrome: Secondary | ICD-10-CM | POA: Diagnosis not present

## 2024-01-29 ENCOUNTER — Ambulatory Visit: Payer: Self-pay | Admitting: Cardiovascular Disease

## 2024-01-29 LAB — CUP PACEART REMOTE DEVICE CHECK
Battery Impedance: 2151 Ohm
Battery Remaining Longevity: 29 mo
Battery Voltage: 2.73 V
Brady Statistic AP VP Percent: 52 %
Brady Statistic AP VS Percent: 0 %
Brady Statistic AS VP Percent: 48 %
Brady Statistic AS VS Percent: 0 %
Date Time Interrogation Session: 20250704152200
Implantable Lead Connection Status: 753985
Implantable Lead Connection Status: 753985
Implantable Lead Implant Date: 20050805
Implantable Lead Implant Date: 20050805
Implantable Lead Location: 753859
Implantable Lead Location: 753860
Implantable Lead Model: 5076
Implantable Lead Model: 5076
Implantable Pulse Generator Implant Date: 20140627
Lead Channel Impedance Value: 436 Ohm
Lead Channel Impedance Value: 487 Ohm
Lead Channel Pacing Threshold Amplitude: 0.625 V
Lead Channel Pacing Threshold Amplitude: 0.625 V
Lead Channel Pacing Threshold Pulse Width: 0.4 ms
Lead Channel Pacing Threshold Pulse Width: 0.4 ms
Lead Channel Setting Pacing Amplitude: 2 V
Lead Channel Setting Pacing Amplitude: 2.5 V
Lead Channel Setting Pacing Pulse Width: 0.4 ms
Lead Channel Setting Sensing Sensitivity: 2 mV
Zone Setting Status: 755011
Zone Setting Status: 755011

## 2024-01-30 DIAGNOSIS — S233XXA Sprain of ligaments of thoracic spine, initial encounter: Secondary | ICD-10-CM | POA: Diagnosis not present

## 2024-01-30 DIAGNOSIS — S338XXA Sprain of other parts of lumbar spine and pelvis, initial encounter: Secondary | ICD-10-CM | POA: Diagnosis not present

## 2024-01-30 DIAGNOSIS — M9902 Segmental and somatic dysfunction of thoracic region: Secondary | ICD-10-CM | POA: Diagnosis not present

## 2024-01-30 DIAGNOSIS — M9903 Segmental and somatic dysfunction of lumbar region: Secondary | ICD-10-CM | POA: Diagnosis not present

## 2024-02-27 DIAGNOSIS — S233XXA Sprain of ligaments of thoracic spine, initial encounter: Secondary | ICD-10-CM | POA: Diagnosis not present

## 2024-02-27 DIAGNOSIS — M9903 Segmental and somatic dysfunction of lumbar region: Secondary | ICD-10-CM | POA: Diagnosis not present

## 2024-02-27 DIAGNOSIS — S338XXA Sprain of other parts of lumbar spine and pelvis, initial encounter: Secondary | ICD-10-CM | POA: Diagnosis not present

## 2024-02-27 DIAGNOSIS — M9902 Segmental and somatic dysfunction of thoracic region: Secondary | ICD-10-CM | POA: Diagnosis not present

## 2024-03-26 DIAGNOSIS — M9902 Segmental and somatic dysfunction of thoracic region: Secondary | ICD-10-CM | POA: Diagnosis not present

## 2024-03-26 DIAGNOSIS — S233XXA Sprain of ligaments of thoracic spine, initial encounter: Secondary | ICD-10-CM | POA: Diagnosis not present

## 2024-03-26 DIAGNOSIS — M9903 Segmental and somatic dysfunction of lumbar region: Secondary | ICD-10-CM | POA: Diagnosis not present

## 2024-03-26 DIAGNOSIS — S338XXA Sprain of other parts of lumbar spine and pelvis, initial encounter: Secondary | ICD-10-CM | POA: Diagnosis not present

## 2024-03-27 DIAGNOSIS — H52223 Regular astigmatism, bilateral: Secondary | ICD-10-CM | POA: Diagnosis not present

## 2024-03-27 DIAGNOSIS — H5202 Hypermetropia, left eye: Secondary | ICD-10-CM | POA: Diagnosis not present

## 2024-03-27 DIAGNOSIS — H524 Presbyopia: Secondary | ICD-10-CM | POA: Diagnosis not present

## 2024-03-27 DIAGNOSIS — H5211 Myopia, right eye: Secondary | ICD-10-CM | POA: Diagnosis not present

## 2024-04-10 DIAGNOSIS — Z6827 Body mass index (BMI) 27.0-27.9, adult: Secondary | ICD-10-CM | POA: Diagnosis not present

## 2024-04-10 DIAGNOSIS — I10 Essential (primary) hypertension: Secondary | ICD-10-CM | POA: Diagnosis not present

## 2024-04-10 DIAGNOSIS — I482 Chronic atrial fibrillation, unspecified: Secondary | ICD-10-CM | POA: Diagnosis not present

## 2024-04-10 DIAGNOSIS — N1832 Chronic kidney disease, stage 3b: Secondary | ICD-10-CM | POA: Diagnosis not present

## 2024-04-10 DIAGNOSIS — E782 Mixed hyperlipidemia: Secondary | ICD-10-CM | POA: Diagnosis not present

## 2024-04-10 DIAGNOSIS — Z23 Encounter for immunization: Secondary | ICD-10-CM | POA: Diagnosis not present

## 2024-04-23 ENCOUNTER — Ambulatory Visit: Payer: Medicare HMO

## 2024-04-23 DIAGNOSIS — S338XXA Sprain of other parts of lumbar spine and pelvis, initial encounter: Secondary | ICD-10-CM | POA: Diagnosis not present

## 2024-04-23 DIAGNOSIS — M9903 Segmental and somatic dysfunction of lumbar region: Secondary | ICD-10-CM | POA: Diagnosis not present

## 2024-04-23 DIAGNOSIS — S233XXA Sprain of ligaments of thoracic spine, initial encounter: Secondary | ICD-10-CM | POA: Diagnosis not present

## 2024-04-23 DIAGNOSIS — M9902 Segmental and somatic dysfunction of thoracic region: Secondary | ICD-10-CM | POA: Diagnosis not present

## 2024-04-23 DIAGNOSIS — I4891 Unspecified atrial fibrillation: Secondary | ICD-10-CM | POA: Diagnosis not present

## 2024-04-25 LAB — CUP PACEART REMOTE DEVICE CHECK
Battery Impedance: 2459 Ohm
Battery Remaining Longevity: 25 mo
Battery Voltage: 2.71 V
Brady Statistic AP VP Percent: 52 %
Brady Statistic AP VS Percent: 0 %
Brady Statistic AS VP Percent: 48 %
Brady Statistic AS VS Percent: 0 %
Date Time Interrogation Session: 20251001103919
Implantable Lead Connection Status: 753985
Implantable Lead Connection Status: 753985
Implantable Lead Implant Date: 20050805
Implantable Lead Implant Date: 20050805
Implantable Lead Location: 753859
Implantable Lead Location: 753860
Implantable Lead Model: 5076
Implantable Lead Model: 5076
Implantable Pulse Generator Implant Date: 20140627
Lead Channel Impedance Value: 420 Ohm
Lead Channel Impedance Value: 495 Ohm
Lead Channel Pacing Threshold Amplitude: 0.625 V
Lead Channel Pacing Threshold Amplitude: 0.625 V
Lead Channel Pacing Threshold Pulse Width: 0.4 ms
Lead Channel Pacing Threshold Pulse Width: 0.4 ms
Lead Channel Setting Pacing Amplitude: 2 V
Lead Channel Setting Pacing Amplitude: 2.5 V
Lead Channel Setting Pacing Pulse Width: 0.4 ms
Lead Channel Setting Sensing Sensitivity: 2 mV
Zone Setting Status: 755011
Zone Setting Status: 755011

## 2024-04-25 NOTE — Progress Notes (Signed)
 Remote PPM Transmission

## 2024-04-29 NOTE — Progress Notes (Signed)
 Remote PPM Transmission

## 2024-05-06 ENCOUNTER — Ambulatory Visit: Payer: Self-pay | Admitting: Cardiovascular Disease

## 2024-05-21 DIAGNOSIS — M9903 Segmental and somatic dysfunction of lumbar region: Secondary | ICD-10-CM | POA: Diagnosis not present

## 2024-05-21 DIAGNOSIS — S338XXA Sprain of other parts of lumbar spine and pelvis, initial encounter: Secondary | ICD-10-CM | POA: Diagnosis not present

## 2024-05-21 DIAGNOSIS — M9902 Segmental and somatic dysfunction of thoracic region: Secondary | ICD-10-CM | POA: Diagnosis not present

## 2024-05-21 DIAGNOSIS — S233XXA Sprain of ligaments of thoracic spine, initial encounter: Secondary | ICD-10-CM | POA: Diagnosis not present

## 2024-05-31 ENCOUNTER — Ambulatory Visit: Attending: Cardiovascular Disease | Admitting: Cardiovascular Disease

## 2024-05-31 ENCOUNTER — Encounter: Payer: Self-pay | Admitting: Cardiovascular Disease

## 2024-05-31 VITALS — BP 134/74 | HR 62 | Ht 63.0 in | Wt 147.0 lb

## 2024-05-31 DIAGNOSIS — Z79899 Other long term (current) drug therapy: Secondary | ICD-10-CM

## 2024-05-31 DIAGNOSIS — I495 Sick sinus syndrome: Secondary | ICD-10-CM

## 2024-05-31 DIAGNOSIS — I4891 Unspecified atrial fibrillation: Secondary | ICD-10-CM

## 2024-05-31 NOTE — Progress Notes (Signed)
    PCP: Trudy Vaughn FALCON, MD   Primary EP:  Dr Young  Cassandra Young is a 89 y.o. female who presents today for routine electrophysiology followup.  Since last being seen in our clinic, the patient reports doing very well.    Today, she denies symptoms of palpitations, chest pain, shortness of breath,  lower extremity edema, dizziness, presyncope, or syncope.  she has no device related complaints -- no new tenderness, drainage, redness.  The patient is otherwise without complaint today.     Physical Exam: Vitals:   05/31/24 0900  BP: 134/74  Pulse: 62  SpO2: 99%  Weight: 147 lb (66.7 kg)  Height: 5' 3 (1.6 m)     Gen: Appears comfortable, well-nourished CV: RRR, no dependent edema The device site is normal -- no tenderness, edema, drainage, redness, threatened erosion. Pulm: breathing easily   Pacemaker interrogation- reviewed in detail today,  See PACEART report    Assessment and Plan:  Symptomatic sinus bradycardia due to transient complete heart block Normal pacemaker function See Pace Art report No changes today she is not device dependant today  HTN BP acceptable today Continue metoprolol to tartrate 25 mg twice daily   Paroxysmal atrial fibrillation AF burden is 12 % (previously 18 %) She is unaware. Rates are controlled Continue eliquis  for chads2vasc score of 4 Check Bmet and CBC  Return in a year  Cassandra FORBES Nancey, MD 05/31/2024 9:19 AM

## 2024-05-31 NOTE — Patient Instructions (Addendum)
 Medication Instructions:  Your physician recommends that you continue on your current medications as directed. Please refer to the Current Medication list given to you today.  Labwork: BMET & CBC [facility] ([address] [place]) or [facility]  Testing/Procedures: none  Follow-Up: Your physician recommends that you schedule a follow-up appointment in: 1 year. You will receive a reminder call in about 8-10 months reminding you to schedule your appointment. If you don't receive this call, please contact our office.  Any Other Special Instructions Will Be Listed Below (If Applicable).  If you need a refill on your cardiac medications before your next appointment, please call your pharmacy.

## 2024-06-01 LAB — CUP PACEART INCLINIC DEVICE CHECK
Battery Impedance: 2635 Ohm
Battery Remaining Longevity: 23 mo
Battery Voltage: 2.72 V
Brady Statistic AP VP Percent: 52 %
Brady Statistic AP VS Percent: 0 %
Brady Statistic AS VP Percent: 48 %
Brady Statistic AS VS Percent: 0 %
Date Time Interrogation Session: 20251107093000
Implantable Lead Connection Status: 753985
Implantable Lead Connection Status: 753985
Implantable Lead Implant Date: 20050805
Implantable Lead Implant Date: 20050805
Implantable Lead Location: 753859
Implantable Lead Location: 753860
Implantable Lead Model: 5076
Implantable Lead Model: 5076
Implantable Pulse Generator Implant Date: 20140627
Lead Channel Impedance Value: 438 Ohm
Lead Channel Impedance Value: 476 Ohm
Lead Channel Pacing Threshold Amplitude: 0.5 V
Lead Channel Pacing Threshold Amplitude: 0.625 V
Lead Channel Pacing Threshold Amplitude: 0.75 V
Lead Channel Pacing Threshold Amplitude: 1 V
Lead Channel Pacing Threshold Pulse Width: 0.4 ms
Lead Channel Pacing Threshold Pulse Width: 0.4 ms
Lead Channel Pacing Threshold Pulse Width: 0.4 ms
Lead Channel Pacing Threshold Pulse Width: 0.4 ms
Lead Channel Sensing Intrinsic Amplitude: 2.8 mV
Lead Channel Setting Pacing Amplitude: 2 V
Lead Channel Setting Pacing Amplitude: 2.5 V
Lead Channel Setting Pacing Pulse Width: 0.4 ms
Lead Channel Setting Sensing Sensitivity: 2 mV
Zone Setting Status: 755011
Zone Setting Status: 755011

## 2024-06-18 DIAGNOSIS — Z85828 Personal history of other malignant neoplasm of skin: Secondary | ICD-10-CM | POA: Diagnosis not present

## 2024-06-18 DIAGNOSIS — L814 Other melanin hyperpigmentation: Secondary | ICD-10-CM | POA: Diagnosis not present

## 2024-06-18 DIAGNOSIS — L821 Other seborrheic keratosis: Secondary | ICD-10-CM | POA: Diagnosis not present

## 2024-06-24 ENCOUNTER — Ambulatory Visit: Payer: Self-pay | Admitting: Cardiovascular Disease

## 2024-06-25 DIAGNOSIS — M9903 Segmental and somatic dysfunction of lumbar region: Secondary | ICD-10-CM | POA: Diagnosis not present

## 2024-06-25 DIAGNOSIS — S338XXA Sprain of other parts of lumbar spine and pelvis, initial encounter: Secondary | ICD-10-CM | POA: Diagnosis not present

## 2024-06-25 DIAGNOSIS — M9902 Segmental and somatic dysfunction of thoracic region: Secondary | ICD-10-CM | POA: Diagnosis not present

## 2024-06-25 DIAGNOSIS — S233XXA Sprain of ligaments of thoracic spine, initial encounter: Secondary | ICD-10-CM | POA: Diagnosis not present

## 2024-07-23 ENCOUNTER — Ambulatory Visit (INDEPENDENT_AMBULATORY_CARE_PROVIDER_SITE_OTHER): Payer: Medicare HMO

## 2024-07-23 DIAGNOSIS — I4891 Unspecified atrial fibrillation: Secondary | ICD-10-CM | POA: Diagnosis not present

## 2024-07-26 LAB — CUP PACEART REMOTE DEVICE CHECK
Battery Impedance: 2584 Ohm
Battery Remaining Longevity: 24 mo
Battery Voltage: 2.72 V
Brady Statistic AP VP Percent: 32 %
Brady Statistic AP VS Percent: 0 %
Brady Statistic AS VP Percent: 68 %
Brady Statistic AS VS Percent: 0 %
Date Time Interrogation Session: 20251231214654
Implantable Lead Connection Status: 753985
Implantable Lead Connection Status: 753985
Implantable Lead Implant Date: 20050805
Implantable Lead Implant Date: 20050805
Implantable Lead Location: 753859
Implantable Lead Location: 753860
Implantable Lead Model: 5076
Implantable Lead Model: 5076
Implantable Pulse Generator Implant Date: 20140627
Lead Channel Impedance Value: 443 Ohm
Lead Channel Impedance Value: 461 Ohm
Lead Channel Pacing Threshold Amplitude: 0.625 V
Lead Channel Pacing Threshold Amplitude: 0.625 V
Lead Channel Pacing Threshold Pulse Width: 0.4 ms
Lead Channel Pacing Threshold Pulse Width: 0.4 ms
Lead Channel Setting Pacing Amplitude: 2 V
Lead Channel Setting Pacing Amplitude: 2.5 V
Lead Channel Setting Pacing Pulse Width: 0.4 ms
Lead Channel Setting Sensing Sensitivity: 2 mV
Zone Setting Status: 755011
Zone Setting Status: 755011

## 2024-07-29 NOTE — Progress Notes (Signed)
 Remote PPM Transmission

## 2024-08-03 ENCOUNTER — Ambulatory Visit: Payer: Self-pay | Admitting: Cardiovascular Disease

## 2024-08-08 ENCOUNTER — Other Ambulatory Visit (HOSPITAL_COMMUNITY): Payer: Self-pay | Admitting: Internal Medicine

## 2024-08-08 DIAGNOSIS — Z1231 Encounter for screening mammogram for malignant neoplasm of breast: Secondary | ICD-10-CM

## 2024-08-16 ENCOUNTER — Encounter: Payer: Self-pay | Admitting: Gastroenterology

## 2024-08-16 ENCOUNTER — Other Ambulatory Visit (HOSPITAL_COMMUNITY): Payer: Self-pay | Admitting: Internal Medicine

## 2024-08-16 DIAGNOSIS — R634 Abnormal weight loss: Secondary | ICD-10-CM

## 2024-09-06 ENCOUNTER — Ambulatory Visit (HOSPITAL_COMMUNITY)

## 2024-09-11 ENCOUNTER — Ambulatory Visit: Admitting: Gastroenterology

## 2024-10-22 ENCOUNTER — Ambulatory Visit: Payer: Self-pay

## 2025-01-21 ENCOUNTER — Ambulatory Visit: Payer: Self-pay

## 2025-04-22 ENCOUNTER — Ambulatory Visit: Payer: Self-pay

## 2025-07-22 ENCOUNTER — Ambulatory Visit: Payer: Self-pay

## 2025-10-21 ENCOUNTER — Ambulatory Visit: Payer: Self-pay
